# Patient Record
Sex: Female | Born: 1990 | Race: White | Hispanic: No | Marital: Single | Smoking: Never smoker
Health system: Southern US, Community
[De-identification: ages and names within clinical notes are randomized; demographics above are authoritative.]

## PROBLEM LIST (undated history)

## (undated) DIAGNOSIS — R51 Headache: Secondary | ICD-10-CM

## (undated) DIAGNOSIS — B019 Varicella without complication: Secondary | ICD-10-CM

## (undated) HISTORY — DX: Varicella without complication: B01.9

## (undated) HISTORY — DX: Headache: R51

---
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
## 2012-10-21 ENCOUNTER — Ambulatory Visit (INDEPENDENT_AMBULATORY_CARE_PROVIDER_SITE_OTHER): Payer: PRIVATE HEALTH INSURANCE | Admitting: Family Medicine

## 2012-10-21 ENCOUNTER — Encounter: Payer: Self-pay | Admitting: Family Medicine

## 2012-10-21 VITALS — BP 110/70 | HR 72 | Temp 99.3°F | Resp 12 | Ht 64.0 in | Wt 222.0 lb

## 2012-10-21 DIAGNOSIS — IMO0002 Reserved for concepts with insufficient information to code with codable children: Secondary | ICD-10-CM

## 2012-10-21 DIAGNOSIS — S86899A Other injury of other muscle(s) and tendon(s) at lower leg level, unspecified leg, initial encounter: Secondary | ICD-10-CM

## 2012-10-21 DIAGNOSIS — L42 Pityriasis rosea: Secondary | ICD-10-CM

## 2012-10-21 NOTE — Progress Notes (Signed)
  Subjective:    Patient ID: [name], female    DOB: 12-25-1990, 22 y.o.   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  She had recent rash which was pruritic and started on her trunk and subsequently arms and upper thighs. Rash had erythematous base and scaly surface and pruritic. She initially thought this may have represented guttate psoriasis but it appears she may have had a herald patch more typical of pityriasis rosea. Onset was about 6 weeks ago and now resolving.  She had occasional shin splints but currently none. These were worst right anterior leg. Improved with icing.  She takes no chronic medications. Breast reduction surgery 2012. Immunizations reportedly up-to-date  Past Medical History  Diagnosis Date  . Chicken pox   . Headache    Past Surgical History  Procedure Laterality Date  . Breast reduction surgery  2012    reports that she has never smoked. She does not have any smokeless tobacco history on file. Her alcohol and drug histories are not on file. family history includes Cancer in her paternal grandfather; Diabetes in her maternal grandfather and maternal grandmother; Hyperlipidemia in her father and paternal grandfather; Hypertension in her father and paternal grandmother; and Stroke in her paternal grandmother. No Known Allergies    Review of Systems  Constitutional: Negative for fever, activity change, appetite change, fatigue and unexpected weight change.  HENT: Negative for hearing loss, ear pain, sore throat and trouble swallowing.   Eyes: Negative for visual disturbance.  Respiratory: Negative for cough and shortness of breath.   Cardiovascular: Negative for chest pain and palpitations.  Gastrointestinal: Negative for abdominal pain, diarrhea, constipation and blood in stool.   Genitourinary: Negative for dysuria and hematuria.  Musculoskeletal: Negative for myalgias, back pain and arthralgias.  Skin: Positive for rash.  Neurological: Negative for dizziness, syncope and headaches.  Hematological: Negative for adenopathy.  Psychiatric/Behavioral: Negative for confusion and dysphoric mood.       Objective:   Physical Exam  Constitutional: She appears well-developed and well-nourished.  Neck: Neck supple. No thyromegaly present.  Cardiovascular: Normal rate and regular rhythm.   Pulmonary/Chest: Effort normal and breath sounds normal. No respiratory distress. She has no wheezes. She has no rales.  Musculoskeletal: She exhibits no edema.  Lymphadenopathy:    She has no cervical adenopathy.  Skin: Rash noted.  Patient has very faint rash erythematous base is slightly scaly but appears to be fading back and upper thigh region          Assessment & Plan:  #1 resolving rash. 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 No further treatment #2 possible intermittent shin splints. Currently asymptomatic. We discussed good cushioned shoe wear and icing after activities if this recurs along with regular stretches #3 patient requesting forms be completed for upcoming travel. This is completed. She is not requiring any immunizations at this time

## 2012-10-21 NOTE — Patient Instructions (Addendum)
Pityriasis Rosea Pityriasis rosea is a rash which is probably caused by a virus. It generally starts as a scaly, red patch on the trunk (the area of the body that a t-shirt would cover) but does not appear on sun exposed areas. The rash is usually preceded by an initial larger spot called the "herald patch" a week or more before the rest of the rash appears. Generally within one to two days the rash appears rapidly on the trunk, upper arms, and sometimes the upper legs. The rash usually appears as flat, oval patches of scaly pink color. The rash can also be raised and one is able to feel it with a finger. The rash can also be finely crinkled and may slough off leaving a ring of scale around the spot. Sometimes a mild sore throat is present with the rash. It usually affects children and young adults in the spring and autumn. Women are more frequently affected than men. TREATMENT  Pityriasis rosea is a self-limited condition. This means it goes away within 4 to 8 weeks without treatment. The spots may persist for several months, especially in darker-colored skin after the rash has resolved and healed. Benadryl and steroid creams may be used if itching is a problem. SEEK MEDICAL CARE IF:   Your rash does not go away or persists longer than three months.  You develop fever and joint pain.  You develop severe headache and confusion.  You develop breathing difficulty, vomiting and/or extreme weakness. Document Released: 07/12/2001 Document Revised: 08/28/2011 Document Reviewed: 07/31/2008 Exit[employer] Patient Information 2013 [employer].  

## 2015-06-24 ENCOUNTER — Ambulatory Visit (INDEPENDENT_AMBULATORY_CARE_PROVIDER_SITE_OTHER): Payer: Commercial Managed Care - HMO | Admitting: Family Medicine

## 2015-06-24 ENCOUNTER — Encounter: Payer: Self-pay | Admitting: Family Medicine

## 2015-06-24 VITALS — BP 110/80 | HR 94 | Temp 98.4°F | Ht 64.5 in | Wt 245.1 lb

## 2015-06-24 DIAGNOSIS — R05 Cough: Secondary | ICD-10-CM

## 2015-06-24 DIAGNOSIS — K219 Gastro-esophageal reflux disease without esophagitis: Secondary | ICD-10-CM | POA: Diagnosis not present

## 2015-06-24 DIAGNOSIS — R059 Cough, unspecified: Secondary | ICD-10-CM

## 2015-06-24 DIAGNOSIS — Z23 Encounter for immunization: Secondary | ICD-10-CM | POA: Diagnosis not present

## 2015-06-24 MED ORDER — MONTELUKAST SODIUM 10 MG PO TABS: 10.0000 mg | ORAL_TABLET | Freq: Every day | ORAL | Status: DC

## 2015-06-24 NOTE — Progress Notes (Signed)
   Subjective:    Patient ID: [name], female    DOB: 1991/03/15, 25 y.o.   MRN: [id number]  HPI Patient seen with frequent cough Nonsmoker no history of asthma. This fall particularly she has had recurrent problems with "feeling anxious followed by cough is sometimes sensation that her chest feels "tight". No exertional chest pain. She feels she may be wheezing occasionally but mostly has cough which can last sometimes for hours.  No appetite or weight changes. No dyspnea. She does have frequent GERD symptoms. Also frequent postnasal drip symptoms. Recently is increased her caffeine consumption. No alleviating factors. Cough seems exacerbated by anxiety.  Past Medical History  Diagnosis Date  . Chicken pox   . EXBMWUXL(244.0Headache(784.0)    Past Surgical History  Procedure Laterality Date  . Breast reduction surgery  2012    reports that she has never smoked. She does not have any smokeless tobacco history on file. Her alcohol and drug histories are not on file. family history includes Cancer in her paternal grandfather; Diabetes in her maternal grandfather and maternal grandmother; Hyperlipidemia in her father and paternal grandfather; Hypertension in her father and paternal grandmother; Stroke in her paternal grandmother. No Known Allergies    Review of Systems  Constitutional: Negative for fever, chills, appetite change, fatigue and unexpected weight change.  HENT: Positive for postnasal drip.   Respiratory: Positive for cough. Negative for shortness of breath.   Cardiovascular: Negative for chest pain, palpitations and leg swelling.  Gastrointestinal: Negative for nausea and vomiting.  Neurological: Negative for dizziness.  Hematological: Negative for adenopathy.       Objective:   Physical Exam  Constitutional: She appears well-developed and well-nourished.  HENT:  Right Ear: External ear normal.  Left Ear: External ear normal.  Mouth/Throat: Oropharynx is clear and  moist.  Neck: Neck supple.  Cardiovascular: Normal rate and regular rhythm.   Pulmonary/Chest: Effort normal and breath sounds normal. No respiratory distress. She has no wheezes. She has no rales.  Lymphadenopathy:    She has no cervical adenopathy.          Assessment & Plan:  Frequent cough. Although reactive airway disease in differential though she has no obvious wheezing on exam today. Question whether her cough may be related to GERD and/or postnasal drip. We also explained anxiety could be a factor in her cough as well. Reduce caffeine consumption. Handout on GERD given. Try over-the-counter Zantac or Pepcid. Treat postnasal drip symptoms. She will try over-the-counter antihistamine. Singulair 10 mg once daily. Reassess in one month.  Consider further testing at that time with pulmonary function tests pre-and postbronchodilator if symptoms persist

## 2015-06-24 NOTE — Progress Notes (Signed)
Pre visit review using our clinic review tool, if applicable. No additional management support is needed unless otherwise documented below in the visit note. 

## 2015-06-24 NOTE — Patient Instructions (Addendum)
Food Choices for Gastroesophageal Reflux Disease, Adult When you have gastroesophageal reflux disease (GERD), the foods you eat and your eating habits are very important. Choosing the right foods can help ease the discomfort of GERD. WHAT GENERAL GUIDELINES DO I NEED TO FOLLOW?  Choose fruits, vegetables, whole grains, low-fat dairy products, and low-fat meat, fish, and poultry.  Limit fats such as oils, salad dressings, butter, nuts, and avocado.  Keep a food diary to identify foods that cause symptoms.  Avoid foods that cause reflux. These may be different for different people.  Eat frequent small meals instead of three large meals each day.  Eat your meals slowly, in a relaxed setting.  Limit fried foods.  Cook foods using methods other than frying.  Avoid drinking alcohol.  Avoid drinking large amounts of liquids with your meals.  Avoid bending over or lying down until 2-3 hours after eating. WHAT FOODS ARE NOT RECOMMENDED? The following are some foods and drinks that may worsen your symptoms: Vegetables Tomatoes. Tomato juice. Tomato and spaghetti sauce. Chili peppers. Onion and garlic. Horseradish. Fruits Oranges, grapefruit, and lemon (fruit and juice). Meats High-fat meats, fish, and poultry. This includes hot dogs, ribs, ham, sausage, salami, and bacon. Dairy Whole milk and chocolate milk. Sour cream. Cream. Butter. Ice cream. Cream cheese.  Beverages Coffee and tea, with or without caffeine. Carbonated beverages or energy drinks. Condiments Hot sauce. Barbecue sauce.  Sweets/Desserts Chocolate and cocoa. Donuts. Peppermint and spearmint. Fats and Oils High-fat foods, including [place] fries and potato chips. Other Vinegar. Strong spices, such as black pepper, white pepper, red pepper, cayenne, curry powder, cloves, ginger, and chili powder. The items listed above may not be a complete list of foods and beverages to avoid. Contact your dietitian for more  information.   This information is not intended to replace advice given to you by your health care provider. Make sure you discuss any questions you have with your health care provider.   Document Released: 06/05/2005 Document Revised: 06/26/2014 Document Reviewed: 04/09/2013 Elsevier Interactive Patient Education 2016 [employer]  Consider over the counter Zantac of Pepcid for any reflux symptoms.

## 2015-07-27 ENCOUNTER — Encounter: Payer: Self-pay | Admitting: Family Medicine

## 2015-07-27 ENCOUNTER — Ambulatory Visit (INDEPENDENT_AMBULATORY_CARE_PROVIDER_SITE_OTHER): Payer: Commercial Managed Care - HMO | Admitting: Family Medicine

## 2015-07-27 VITALS — BP 100/80 | HR 104 | Temp 98.4°F | Ht 64.5 in | Wt 239.2 lb

## 2015-07-27 DIAGNOSIS — R05 Cough: Secondary | ICD-10-CM | POA: Diagnosis not present

## 2015-07-27 DIAGNOSIS — R053 Chronic cough: Secondary | ICD-10-CM

## 2015-07-27 NOTE — Progress Notes (Signed)
Pre visit review using our clinic review tool, if applicable. No additional management support is needed unless otherwise documented below in the visit note. 

## 2015-07-27 NOTE — Patient Instructions (Signed)
Consider chlorpheniramine at night for postnasal drip symptoms. Consider Flonase or Nasacort for nasal congestion symptoms

## 2015-07-27 NOTE — Progress Notes (Signed)
   Subjective:    Patient ID: [name], female    DOB: 1991/03/27, 25 y.o.   MRN: [id number]  HPI Patient is seen for follow-up regarding chronic cough. Refer to prior dictation. We suspected probably combination of GERD and postnasal drip. Question of some reactive airway component. Patient started Singulair and states that she is at least 50% improved. Has not elevated head of bed. Still has some postnasal drip symptoms.  Cough nonproductive. No fevers or chills. No dyspnea. Start exercising without difficulty. Never smoked  Past Medical History  Diagnosis Date  . Chicken pox   . MVHQIONG(295.2)    Past Surgical History  Procedure Laterality Date  . Breast reduction surgery  2012    reports that she has never smoked. She does not have any smokeless tobacco history on file. Her alcohol and drug histories are not on file. family history includes Cancer in her paternal grandfather; Diabetes in her maternal grandfather and maternal grandmother; Hyperlipidemia in her father and paternal grandfather; Hypertension in her father and paternal grandmother; Stroke in her paternal grandmother. No Known Allergies    Review of Systems  Constitutional: Negative for fever, chills, appetite change and unexpected weight change.  HENT: Positive for congestion.   Respiratory: Positive for cough. Negative for shortness of breath and wheezing.   Cardiovascular: Negative for chest pain.       Objective:   Physical Exam  Constitutional: She appears well-developed and well-nourished.  HENT:  Right Ear: External ear normal.  Left Ear: External ear normal.  Mouth/Throat: Oropharynx is clear and moist.  Neck: Neck supple.  Cardiovascular: Normal rate and regular rhythm.   No murmur heard. Pulmonary/Chest: Effort normal and breath sounds normal. No respiratory distress. She has no wheezes. She has no rales.          Assessment & Plan:  Chronic cough. Differential remains likely GERD  and/or postnasal drip. No obvious reactive airway component. She will continue Singulair. Consider over-the-counter chlorpheniramine at night for postnasal drip symptoms. Continue Zantac or Pepcid. Elevate head of bed 6-8 inches. Touch base if cough not continuing to resolve over the next few weeks

## 2015-10-26 ENCOUNTER — Other Ambulatory Visit: Payer: Self-pay | Admitting: Family Medicine

## 2016-08-01 DIAGNOSIS — R110 Nausea: Secondary | ICD-10-CM | POA: Diagnosis not present

## 2016-08-01 DIAGNOSIS — R197 Diarrhea, unspecified: Secondary | ICD-10-CM | POA: Diagnosis not present

## 2016-09-13 ENCOUNTER — Ambulatory Visit (INDEPENDENT_AMBULATORY_CARE_PROVIDER_SITE_OTHER): Payer: Commercial Managed Care - HMO | Admitting: Family Medicine

## 2016-09-13 ENCOUNTER — Encounter: Payer: Self-pay | Admitting: Family Medicine

## 2016-09-13 VITALS — BP 120/80 | HR 84 | Temp 98.2°F | Wt 217.9 lb

## 2016-09-13 DIAGNOSIS — R229 Localized swelling, mass and lump, unspecified: Secondary | ICD-10-CM

## 2016-09-13 MED ORDER — DICLOFENAC SODIUM 1 % TD GEL: 4.0000 g | Freq: Four times a day (QID) | TRANSDERMAL | 2 refills | Status: DC

## 2016-09-13 NOTE — Progress Notes (Signed)
Subjective:     Patient ID: [name], female   DOB: 05-28-1991, 26 y.o.   MRN: [id number]  HPI She is seen with left lower back pain intermittently for the past year. She has noted a tender "spot" which is very rounded and mobile. No clear exacerbating factors. She does seem to get some relief with lying supine and sometimes worse with movement. No injury. She's tried heat and topical sports cream with some relief. Infrequently has taken Motrin with some relief. No abdominal pain. No fever. No radiculopathy symptoms. Denies any lower extremity numbness or weakness.  Past Medical History:  Diagnosis Date  . Chicken pox   . DGUYQIHK(742.5Headache(784.0)    Past Surgical History:  Procedure Laterality Date  . BREAST REDUCTION SURGERY  2012    reports that she has never smoked. She has never used smokeless tobacco. Her alcohol and drug histories are not on file. family history includes Cancer in her paternal grandfather; Diabetes in her maternal grandfather and maternal grandmother; Hyperlipidemia in her father and paternal grandfather; Hypertension in her father and paternal grandmother; Stroke in her paternal grandmother. No Known Allergies   Review of Systems  Constitutional: Negative for appetite change, chills, fever and unexpected weight change.  Respiratory: Negative for shortness of breath.   Cardiovascular: Negative for chest pain.  Gastrointestinal: Negative for abdominal pain.  Genitourinary: Negative for dysuria.  Musculoskeletal: Positive for back pain.       Objective:   Physical Exam  Constitutional: She appears well-developed and well-nourished.  Cardiovascular: Normal rate and regular rhythm.   Pulmonary/Chest: Effort normal and breath sounds normal. No respiratory distress. She has no wheezes. She has no rales.  Musculoskeletal: She exhibits no edema.  Straight leg raises are negative bilaterally. She has no spinal tenderness. She has rounded mobile tender fibromatous type  lesion left lower back  Neurological:  2+ reflexes knee and ankle bilaterally. Full-strength lower extremities       Assessment:     Left lower lumbar back pain without radiculopathy- suspect fibro-fatty nodule.  Pain seems to be confined to that region.    Plan:     -trial of diclofenac 1% gel 3-4 times daily as needed -We recommended aerobic activity as tolerated -She is encouraged to lose some weight and also work on strengthening core muscles -touch base in 2 weeks if no relief.   Kristian CoveyBruce W Creedon Danielski MD [facility]

## 2016-09-13 NOTE — Progress Notes (Signed)
Pre visit review using our clinic review tool, if applicable. No additional management support is needed unless otherwise documented below in the visit note. 

## 2016-09-13 NOTE — Patient Instructions (Signed)
Touch base in a few weeks if no improvement with the Diclofenac gel.

## 2016-11-28 DIAGNOSIS — R05 Cough: Secondary | ICD-10-CM | POA: Diagnosis not present

## 2016-11-28 DIAGNOSIS — J0100 Acute maxillary sinusitis, unspecified: Secondary | ICD-10-CM | POA: Diagnosis not present

## 2016-11-28 DIAGNOSIS — H66001 Acute suppurative otitis media without spontaneous rupture of ear drum, right ear: Secondary | ICD-10-CM | POA: Diagnosis not present

## 2017-02-09 ENCOUNTER — Ambulatory Visit (INDEPENDENT_AMBULATORY_CARE_PROVIDER_SITE_OTHER): Payer: 59 | Admitting: Family Medicine

## 2017-02-09 DIAGNOSIS — Z0000 Encounter for general adult medical examination without abnormal findings: Secondary | ICD-10-CM | POA: Diagnosis not present

## 2017-02-09 DIAGNOSIS — Z111 Encounter for screening for respiratory tuberculosis: Secondary | ICD-10-CM | POA: Diagnosis not present

## 2017-02-09 NOTE — Progress Notes (Signed)
Subjective:     Patient ID: [name], female   DOB: 1990-10-11, 26 y.o.   MRN: [id number]  HPI Patient seen for physical exam. 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 Generally healthy. Takes no regular medications. She is fairly certain she had tetanus booster about 3 years ago. She lived in [place] up until 6 years ago. No history of tuberculosis. No recent cough or fever. She sees gynecologist for pelvic exams and Pap smears. Nonsmoker.  Past Medical History:  Diagnosis Date  . Chicken pox   . JKDTOIZT(245.8)    Past Surgical History:  Procedure Laterality Date  . BREAST REDUCTION SURGERY  2012    reports that she has never smoked. She has never used smokeless tobacco. Her alcohol and drug histories are not on file. family history includes Cancer in her paternal grandfather; Diabetes in her maternal grandfather and maternal grandmother; Hyperlipidemia in her father and paternal grandfather; Hypertension in her father and paternal grandmother; Stroke in her paternal grandmother. No Known Allergies   Review of Systems  Constitutional: Negative for activity change, appetite change, fatigue, fever and unexpected weight change.  HENT: Negative for ear pain, hearing loss, sore throat and trouble swallowing.   Eyes: Negative for visual disturbance.  Respiratory: Negative for cough and shortness of breath.   Cardiovascular: Negative for chest pain and palpitations.  Gastrointestinal: Negative for abdominal pain, blood in stool, constipation and diarrhea.  Genitourinary: Negative for dysuria and hematuria.  Musculoskeletal: Negative for arthralgias, back pain and myalgias.  Skin: Negative for rash.  Neurological: Negative for dizziness, syncope and headaches.  Hematological: Negative for adenopathy.  Psychiatric/Behavioral: Negative for confusion and dysphoric mood.       Objective:   Physical Exam  Constitutional: She is oriented to person, place,  and time. She appears well-developed and well-nourished.  HENT:  Head: Normocephalic and atraumatic.  Eyes: Pupils are equal, round, and reactive to light. EOM are normal.  Neck: Normal range of motion. Neck supple. No thyromegaly present.  Cardiovascular: Normal rate, regular rhythm and normal heart sounds.  Friction rub: .bwbn.   No murmur heard. Pulmonary/Chest: Breath sounds normal. No respiratory distress. She has no wheezes. She has no rales.  Abdominal: Soft. Bowel sounds are normal. She exhibits no distension and no mass. There is no tenderness. There is no rebound and no guarding.  Musculoskeletal: Normal range of motion. She exhibits no edema.  Lymphadenopathy:    She has no cervical adenopathy.  Neurological: She is alert and oriented to person, place, and time. She displays normal reflexes. No cranial nerve deficit.  Skin: No rash noted.  Psychiatric: She has a normal mood and affect. Her behavior is normal. Judgment and thought content normal.       Assessment:     Physical exam. No chronic medical problems    Plan:     -obtain blood screening for tuberculosis (particularly with prior living abroad). She was unable to come back in 72 hours to check skin test. -We discussed other screening labs such as cholesterol and blood sugar and she declines at this time -We'll advise flu vaccine and she wishes to wait until later in the season  Kristian Covey MD [facility] at [facility]

## 2017-02-10 LAB — QUANTIFERON TB GOLD ASSAY (BLOOD)
Interferon Gamma Release Assay: NEGATIVE
Mitogen-Nil: 6.84 IU/mL
QUANTIFERON NIL VALUE: 0.07 [IU]/mL
Quantiferon Tb Ag Minus Nil Value: <0 IU/mL

## 2017-03-23 ENCOUNTER — Encounter: Payer: Self-pay | Admitting: Family Medicine

## 2017-03-23 ENCOUNTER — Ambulatory Visit (INDEPENDENT_AMBULATORY_CARE_PROVIDER_SITE_OTHER): Payer: BC Managed Care – PPO | Admitting: Family Medicine

## 2017-03-23 VITALS — BP 110/80 | HR 82 | Temp 98.4°F | Wt 226.2 lb

## 2017-03-23 DIAGNOSIS — J0110 Acute frontal sinusitis, unspecified: Secondary | ICD-10-CM | POA: Diagnosis not present

## 2017-03-23 MED ORDER — AMOXICILLIN-POT CLAVULANATE 875-125 MG PO TABS: 1.0000 | ORAL_TABLET | Freq: Two times a day (BID) | ORAL | 0 refills | Status: DC

## 2017-03-23 NOTE — Patient Instructions (Signed)
Warm compresses several times daily Consider OTC sudafed Stay well hydrated Consider OTC Mucinex.

## 2017-03-23 NOTE — Progress Notes (Signed)
Subjective:     Patient ID: [name], [name]   DOB: 01/24/1991, 26 y.o.   MRN: [id number]  HPI   Patient seen with pain right frontal sinus region past few days. She's also had some right ear fullness and pain around her right ear region. Minimal postnasal congestion and drainage. No purulent secretions. No fevers or chills. No nausea or vomiting. Questionable history of mild migraines in the past but these symptoms are slightly different. No clear exacerbating factors. Symptoms are relatively continuous. She's not tried any decongestants or other medications. Pain is non-throbbing and she does not have any light sensitivity or significant nausea.  Past Medical History:  Diagnosis Date  . Chicken pox   . NWGNFAOZ(308.6)    Past Surgical History:  Procedure Laterality Date  . BREAST REDUCTION SURGERY  2012    reports that she has never smoked. She has never used smokeless tobacco. Her alcohol and drug histories are not on file. family history includes Cancer in her paternal grandfather; Diabetes in her maternal grandfather and maternal grandmother; Hyperlipidemia in her father and paternal grandfather; Hypertension in her father and paternal grandmother; Stroke in her paternal grandmother. No Known Allergies   Review of Systems  Constitutional: Negative for chills and fever.  HENT: Negative for ear discharge and hearing loss.   Respiratory: Negative for cough and shortness of breath.        Objective:   Physical Exam  Constitutional: She appears well-developed and well-nourished.  HENT:  Right Ear: External ear normal.  Left Ear: External ear normal.  Mouth/Throat: Oropharynx is clear and moist.  Neck: Neck supple.  Cardiovascular: Normal rate and regular rhythm.   Pulmonary/Chest: Effort normal and breath sounds normal. No respiratory distress. She has no wheezes. She has no rales.  Lymphadenopathy:    She has no cervical adenopathy.       Assessment:     Patient  seen with three-day history of right retro-orbital pain. Question sinus related. Doubt migraine.    Plan:     -Try over-the-counter decongestant such as Sudafed -Start over-the-counter Mucinex -Warm compresses several times daily -Printed prescription for Augmentin to start one twice a day if she has any purulent secretions or worsening symptoms -follow up for any persistent headache or other concerns.  Kristian Covey MD [facility] at [facility]

## 2017-04-11 ENCOUNTER — Telehealth: Payer: Self-pay | Admitting: Family Medicine

## 2017-04-11 MED ORDER — FLUCONAZOLE 150 MG PO TABS: 150.0000 mg | ORAL_TABLET | Freq: Once | ORAL | 0 refills | Status: AC

## 2017-04-11 NOTE — Telephone Encounter (Signed)
Fluconazole 150mg #1 dose

## 2017-04-11 NOTE — Telephone Encounter (Signed)
Pt was seen on 03/23/17 and given abx now has yeast infection. Pt would like diflucan send to [facility]

## 2017-07-18 ENCOUNTER — Encounter: Payer: Self-pay | Admitting: Family Medicine

## 2017-07-18 ENCOUNTER — Ambulatory Visit: Payer: BC Managed Care – PPO | Admitting: Family Medicine

## 2017-07-18 VITALS — BP 110/80 | HR 81 | Temp 98.3°F | Wt 204.5 lb

## 2017-07-18 DIAGNOSIS — J069 Acute upper respiratory infection, unspecified: Secondary | ICD-10-CM

## 2017-07-18 MED ORDER — DOXYCYCLINE HYCLATE 100 MG PO CAPS: 100.0000 mg | ORAL_CAPSULE | Freq: Two times a day (BID) | ORAL | 0 refills | Status: DC

## 2017-07-18 NOTE — Progress Notes (Signed)
Subjective:     Patient ID: [name], female   DOB: 1990/10/31, 27 y.o.   MRN: [id number]  HPI Patient seen with onset about a week ago of cough, sore throat, body aches, nasal congestion. She felt somewhat better until 2 days ago when she seemed to have a relapse. She thinks she has some low-grade fever last weekend. Tried Mucinex and also doing saline nasal irrigation with Nettie pot.  Past Medical History:  Diagnosis Date  . Chicken pox   . WUJWJXBJ(478.2Headache(784.0)    Past Surgical History:  Procedure Laterality Date  . BREAST REDUCTION SURGERY  2012    reports that  has never smoked. she has never used smokeless tobacco. Her alcohol and drug histories are not on file. family history includes Cancer in her paternal grandfather; Diabetes in her maternal grandfather and maternal grandmother; Hyperlipidemia in her father and paternal grandfather; Hypertension in her father and paternal grandmother; Stroke in her paternal grandmother. No Known Allergies   Review of Systems  Constitutional: Positive for fatigue. Negative for chills and fever.  HENT: Positive for congestion, postnasal drip, sinus pain and sore throat.   Respiratory: Positive for cough.        Objective:   Physical Exam  Constitutional: She appears well-developed and well-nourished. No distress.  HENT:  Right Ear: External ear normal.  Left Ear: External ear normal.  Mouth/Throat: Oropharynx is clear and moist.  Neck: Neck supple.  Cardiovascular: Normal rate and regular rhythm.  Pulmonary/Chest: Effort normal and breath sounds normal. No respiratory distress. She has no wheezes. She has no rales.  Lymphadenopathy:    She has no cervical adenopathy.       Assessment:     Acute sinusitis. We explained most sinusitis is viral.    Plan:     -stay well-hydrated and continue Mucinex and saline nasal irrigation -Printed prescription for doxycycline to start 100 mg twice daily for 10 days if she has any persistent  or worsening symptoms  Kristian CoveyBruce W Maliik Karner MD [facility]

## 2018-03-14 ENCOUNTER — Other Ambulatory Visit: Payer: Self-pay | Admitting: Obstetrics & Gynecology

## 2018-03-14 DIAGNOSIS — N6320 Unspecified lump in the left breast, unspecified quadrant: Secondary | ICD-10-CM

## 2018-06-26 ENCOUNTER — Encounter: Payer: Self-pay | Admitting: Family Medicine

## 2018-06-26 ENCOUNTER — Other Ambulatory Visit: Payer: Self-pay

## 2018-06-26 ENCOUNTER — Ambulatory Visit: Payer: BC Managed Care – PPO | Admitting: Family Medicine

## 2018-06-26 VITALS — BP 128/76 | HR 79 | Temp 98.2°F | Ht 63.0 in | Wt 198.9 lb

## 2018-06-26 DIAGNOSIS — F32A Depression, unspecified: Secondary | ICD-10-CM

## 2018-06-26 DIAGNOSIS — F419 Anxiety disorder, unspecified: Secondary | ICD-10-CM | POA: Diagnosis not present

## 2018-06-26 DIAGNOSIS — F329 Major depressive disorder, single episode, unspecified: Secondary | ICD-10-CM

## 2018-06-26 NOTE — Patient Instructions (Signed)
We will set up psychiatry referral.  Keep caffeine intake low. Follow up immediately for any increased suicidal ideation.

## 2018-06-26 NOTE — Progress Notes (Signed)
Subjective:     Patient ID: [name], female   DOB: 1990/10/09, 28 y.o.   MRN: [id number]  HPI Patient is seen with concerns for progressive anxiety symptoms and "panic attacks ".  Not clear that she has been formally diagnosed with this (panic disorder) in the past.  She has been to some type of mood treatment center near [place] and apparently saw a Publishing rights manager.  We have no records.  She is currently on Prozac 40 mg daily.  She states she underwent break-up with boyfriend recently and that has exacerbated her depression and anxiety symptoms.  She has increased depression and anxiety at baseline but has had a few discrete episodes of anxiety which usually last about 30 minutes of symptoms including dyspnea, nausea, shakes, tremors.  She is consistently taking her Prozac.  She states she is had a long history of some increased irritability and outbursts of anger at times.  She has been getting some counseling therapy She had some fleeting suicidal thoughts but no active suicidal ideation at this time.  She has had some difficulty sleeping.  She has been taking over-the-counter Benadryl type medication with some relief.  Has had periods of more impulsive behavior and decreased sleep, associated with increased irritability.  No FH of bipolar.    Past Medical History:  Diagnosis Date  . Chicken pox   . PNTIRWER(154.0)    Past Surgical History:  Procedure Laterality Date  . BREAST REDUCTION SURGERY  2012    reports that she has never smoked. She has never used smokeless tobacco. No history on file for alcohol and drug. family history includes Cancer in her paternal grandfather; Diabetes in her maternal grandfather and maternal grandmother; Hyperlipidemia in her father and paternal grandfather; Hypertension in her father and paternal grandmother; Stroke in her paternal grandmother. No Known Allergies   Review of Systems  Constitutional: Negative for appetite change and  unexpected weight change.  Cardiovascular: Negative for chest pain.  Psychiatric/Behavioral: Positive for dysphoric mood and sleep disturbance. Negative for confusion and self-injury. The patient is nervous/anxious.        Objective:   Physical Exam Constitutional:      Appearance: Normal appearance.  Cardiovascular:     Rate and Rhythm: Normal rate and regular rhythm.  Pulmonary:     Effort: Pulmonary effort is normal.     Breath sounds: Normal breath sounds.  Neurological:     General: No focal deficit present.     Mental Status: She is alert and oriented to person, place, and time.  Psychiatric:     Comments: PHQ 9 score of 20.  Mood disorder questionnaire 13/13+ response        Assessment:     Patient presents with some progressive anxiety and depression symptoms.  Very high score on PHQ 9 as well as positive mood disorder questionnaire.  This raises concern for possible bipolar.  Patient is concerned that she may have bipolar or borderline personality disorder.  No active suicidal thoughts currently.  She is also describing some discrete episodes of anxiety that sound like possible panic disorder.    Plan:     -We recommend a psychiatry referral to try to get in as soon as possible -If unable to get in soon we may consider starting mood stabilizer -She knows to follow-up immediately for any suicidal ideation or other concerns -she will continue with the Prozac at this time.  Kristian Covey MD [facility] at [facility]

## 2018-06-28 ENCOUNTER — Telehealth: Payer: Self-pay

## 2018-06-28 NOTE — Telephone Encounter (Signed)
Called patient and left a voice message for her to call us back.  OK for PEC to discuss/schedule.  Per Dr. Caryl Never, please reach out to   1) Dr. Alanson Aly at [facility] at [address], [place], [place] [address] and their number is [phone]  OR  2) Dr. Milagros Evener at [facility] at [address], [place], [place] [address] and their number is [phone]  CRM Created.

## 2018-07-01 ENCOUNTER — Telehealth: Payer: Self-pay

## 2018-07-01 NOTE — Telephone Encounter (Signed)
Just an FYI, patient has been given info.  Copied from CRM [id number]. Topic: Higher education careers adviser Patient (Clinic Use ONLY) >> Jun 28, 2018  4:01 PM Ronita Hipps, CMA wrote: Reason for CRM: Called patient and left a voice message for her to call us back.  OK for PEC to discuss/schedule.  Per Dr. Caryl Never, please reach out to   1) Dr. Alanson Aly at [facility] at [address], [place], [place] [address] and their number is [phone]  OR  2) Dr. Milagros Evener at [facility] at [address], [place], [place] [address] and their number is [phone]  CRM Created. >> Jul 01, 2018 11:08 AM Leafy Ro wrote: Pt has been given the name and phone number of psych >> Jul 01, 2018 12:33 PM Maisie Fus, CMA wrote: Will send to [facility] to make aware pt has been notified. Nothing further needed.

## 2018-07-10 ENCOUNTER — Encounter: Payer: Self-pay | Admitting: Family Medicine

## 2018-07-16 ENCOUNTER — Other Ambulatory Visit: Payer: Self-pay

## 2018-07-16 MED ORDER — ARIPIPRAZOLE 5 MG PO TABS: 5.0000 mg | ORAL_TABLET | Freq: Every day | ORAL | 0 refills | Status: DC

## 2018-07-29 ENCOUNTER — Ambulatory Visit: Payer: BC Managed Care – PPO | Admitting: Family Medicine

## 2018-07-29 ENCOUNTER — Other Ambulatory Visit: Payer: Self-pay

## 2018-07-29 ENCOUNTER — Encounter: Payer: Self-pay | Admitting: Family Medicine

## 2018-07-29 VITALS — BP 124/82 | HR 83 | Temp 98.4°F | Ht 63.0 in | Wt 203.9 lb

## 2018-07-29 DIAGNOSIS — F419 Anxiety disorder, unspecified: Secondary | ICD-10-CM

## 2018-07-29 DIAGNOSIS — F329 Major depressive disorder, single episode, unspecified: Secondary | ICD-10-CM | POA: Diagnosis not present

## 2018-07-29 MED ORDER — ARIPIPRAZOLE 5 MG PO TABS: 5.0000 mg | ORAL_TABLET | Freq: Every day | ORAL | 0 refills | Status: DC

## 2018-07-29 NOTE — Progress Notes (Signed)
  Subjective:     Patient ID: [name], female   DOB: 08-03-1990, 28 y.o.   MRN: [id number]  HPI Patient seen for follow-up regarding anxiety and depression.  Refer to previous note from 06/26/2018.  She had had previous question of panic disorder.  She came to [place] on Prozac 40 mg daily.  Recent break-up with boyfriend which she felt exacerbated her depression and anxiety symptoms.  She had noted increased irritability and outbursts of anger frequently.  She had been getting some regular counseling.  She had difficulty sleeping.  She was also concerned about some impulsive behaviors and decreased need for sleep at times.  We had concern for possible bipolar disorder because of positive mood disorders questionnaire.  She also scored 20 on PHQ 9.  We recommend a psychiatry referral for further evaluation.  Unfortunately though she could not get in until March.  We ended up adding Abilify 5 mg at night and she thinks this has helped tremendously with her mood.  She thinks this has helped predominantly with her depression symptoms.  No suicidal ideation.  She is coping better with anxiety as well.  She is also sleeping somewhat better.  Has noticed some improvement in appetite over the past couple weeks as well  Past Medical History:  Diagnosis Date  . Chicken pox   . TMAUQJFH(545.6)    Past Surgical History:  Procedure Laterality Date  . BREAST REDUCTION SURGERY  2012    reports that she has never smoked. She has never used smokeless tobacco. She reports current alcohol use of about 10.0 - 11.0 standard drinks of alcohol per week. She reports current drug use. Drug: Marijuana. family history includes Cancer in her paternal grandfather; Diabetes in her maternal grandfather and maternal grandmother; Hyperlipidemia in her father and paternal grandfather; Hypertension in her father and paternal grandmother; Stroke in her paternal grandmother. No Known Allergies   Review of Systems  Respiratory:  Negative for shortness of breath.   Cardiovascular: Negative for chest pain.  Neurological: Negative for dizziness and headaches.  Psychiatric/Behavioral: Negative for agitation, confusion, hallucinations, sleep disturbance and suicidal ideas. The patient is nervous/anxious.        Objective:   Physical Exam Constitutional:      Appearance: Normal appearance.  Cardiovascular:     Rate and Rhythm: Normal rate and regular rhythm.  Pulmonary:     Effort: Pulmonary effort is normal.     Breath sounds: Normal breath sounds.  Neurological:     Mental Status: She is alert.  Psychiatric:     Comments: PHQ 9 score of 13 compared with 20 few weeks ago        Assessment:     Depression and anxiety-improved with addition of Abilify to her Prozac.    Plan:     -She is encouraged to keep her psychiatry appointment which is been made.  She will continue with her counseling in the meantime.  Kristian Covey MD [facility] at [facility]

## 2018-08-14 ENCOUNTER — Encounter: Payer: Self-pay | Admitting: Family Medicine

## 2018-08-16 ENCOUNTER — Other Ambulatory Visit: Payer: Self-pay

## 2018-08-16 ENCOUNTER — Encounter: Payer: Self-pay | Admitting: Family Medicine

## 2018-08-16 ENCOUNTER — Ambulatory Visit: Payer: BC Managed Care – PPO | Admitting: Family Medicine

## 2018-08-16 VITALS — BP 120/78 | HR 96 | Temp 97.9°F | Ht 63.0 in | Wt 213.9 lb

## 2018-08-16 DIAGNOSIS — J019 Acute sinusitis, unspecified: Secondary | ICD-10-CM

## 2018-08-16 MED ORDER — FLUCONAZOLE 150 MG PO TABS: 150.0000 mg | ORAL_TABLET | Freq: Once | ORAL | 0 refills | Status: AC

## 2018-08-16 MED ORDER — AMOXICILLIN-POT CLAVULANATE 875-125 MG PO TABS: 1.0000 | ORAL_TABLET | Freq: Two times a day (BID) | ORAL | 0 refills | Status: DC

## 2018-08-16 NOTE — Progress Notes (Signed)
  Subjective:     Patient ID: [name], female   DOB: 1991-06-11, 28 y.o.   MRN: [id number]  HPI Patient had typical URI few weeks ago.  After about 7 to 10 days her symptoms were improving but then about a week ago she seemed to relapse with headaches, maxillary facial pain and pressure, increased fatigue, postnasal drainage, sore throat, occasional hoarseness.  She is taken over-the-counter Mucinex and drinking hot teas.  She has noticed some yellow discoloration with her mucus over the past week.  Past Medical History:  Diagnosis Date  . Chicken pox   . NOIBBCWU(889.1)    Past Surgical History:  Procedure Laterality Date  . BREAST REDUCTION SURGERY  2012    reports that she has never smoked. She has never used smokeless tobacco. She reports current alcohol use of about 10.0 - 11.0 standard drinks of alcohol per week. She reports current drug use. Drug: Marijuana. family history includes Cancer in her paternal grandfather; Diabetes in her maternal grandfather and maternal grandmother; Hyperlipidemia in her father and paternal grandfather; Hypertension in her father and paternal grandmother; Stroke in her paternal grandmother. No Known Allergies   Review of Systems  Constitutional: Negative for chills and fever.  HENT: Positive for congestion, sinus pressure and sinus pain.   Respiratory: Negative for cough, shortness of breath and wheezing.   Neurological: Positive for headaches.       Objective:   Physical Exam Constitutional:      Appearance: Normal appearance.  HENT:     Right Ear: Tympanic membrane normal.     Left Ear: Tympanic membrane normal.     Mouth/Throat:     Pharynx: Oropharynx is clear. No oropharyngeal exudate or posterior oropharyngeal erythema.  Neck:     Musculoskeletal: Neck supple.  Cardiovascular:     Rate and Rhythm: Normal rate and regular rhythm.  Pulmonary:     Effort: Pulmonary effort is normal.     Breath sounds: Normal breath sounds.   Lymphadenopathy:     Cervical: No cervical adenopathy.  Neurological:     Mental Status: She is alert.        Assessment:     Probable acute sinusitis following recent viral URI    Plan:     -Augmentin 875 mg twice daily with food for 10 days -Patient requesting fluconazole.  She states she has had frequent yeast vaginitis in the past with antibiotics.  Fluconazole 150 mg x 1 dose -Follow-up for any persistent or worsening symptoms  Kristian Covey MD [facility] at [facility]

## 2018-08-19 ENCOUNTER — Encounter: Payer: Self-pay | Admitting: Psychiatry

## 2018-08-19 ENCOUNTER — Ambulatory Visit (INDEPENDENT_AMBULATORY_CARE_PROVIDER_SITE_OTHER): Payer: BC Managed Care – PPO | Admitting: Psychiatry

## 2018-08-19 VITALS — BP 130/77 | HR 95 | Ht 64.0 in | Wt 214.0 lb

## 2018-08-19 DIAGNOSIS — F4001 Agoraphobia with panic disorder: Secondary | ICD-10-CM

## 2018-08-19 DIAGNOSIS — F3162 Bipolar disorder, current episode mixed, moderate: Secondary | ICD-10-CM | POA: Diagnosis not present

## 2018-08-19 DIAGNOSIS — F411 Generalized anxiety disorder: Secondary | ICD-10-CM | POA: Diagnosis not present

## 2018-08-19 MED ORDER — SERTRALINE HCL 100 MG PO TABS: ORAL_TABLET | ORAL | 1 refills | Status: DC

## 2018-08-19 NOTE — Progress Notes (Addendum)
[facility] MD/PA/NP Initial Note  08/19/2018 1:53 PM [name]  MRN:  [id number]  Chief Complaint:  Chief Complaint    Anxiety; Depression      HPI: refer by PCP Dr. Evelena Peat.  Problems with depression and anxiety.  Had anxiety as long as she can remember.  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  Also more anger and bouts of lashing out at BF.  Very paranoid, always wondering if he was telling the truth.  Always worried about things, like when off from school and wondering what was going on there.  Increase panic with breakup with fiance.  Goes to therapy but hard to use the coping skills she's learned.  Hard to get OOB.  Can be productive at work bc restless but goes from no energy to restless.  Pt reports that mood is Angry, Depressed, Dysphoric, Irritable and Worthless and describes anxiety as Panic Attacks. Anxiety symptoms include: Excessive Worry, Panic Symptoms,. Pt reports has difficulty falling asleep, has frequent nighttime awakenings, is not rested upon awakening and sleeps excessively. Pt reports that appetite is poor. Pt reports that energy is poor and anhedonia, loss of interest or pleasure in usual activities, poor motivation and withdrawn from usual activities. Concentration is down slightly. Suicidal thoughts:  patient admits to thoughts but denies active plan or intent.  Lessened a little lately.  Panic now every other week.  When worse it was several per week.  SOB, crying, racing thoughts, can't calm herself, NV.  Last 15 mins.  Sometimes avoids things and going out and seeing people.  Avoiding large stores.  Ok driving.  Has done research into bipolar and borderline personality.  Always been very up and down. bouts of anger she couldn't control.  Has broken things.  MDQ postitive and she does believe she's currently both depressed and hypomanic.  Cycles with multiple sx.  Often mixed sx.  Biggest upswing 18 mos ago.  Way more sexually  active, life of the party, decreased sleep, increased spending, drinking and smoking a lot.  Several episodes.  Not regarding consequences.  Others noticed hyperactivity and hypersexuality.  Currently was taking Prozac 40 and Abilify 5.  Abilify helped mood but extreme restless after 2 weeks and had to stop it.  Off for 2 weeks.  Marijuana every other day for anxiety.  Helps appetite.  Only pm.  Alcohol now 10 or less and used to be higher.  No DUI.   In the past had problems with Xanax and Klonopin and was abusing 18 mos ago.  Visit Diagnosis:    ICD-10-CM   1. Bipolar 1 disorder, mixed, moderate (HCC) F31.62   2. Panic disorder with agoraphobia F40.01   3. Generalized anxiety disorder F41.1     Past Psychiatric History:   [facility].  Not very helpful. Rx Lexapro made chest heavy and worsened anxiety took for 3 weeks. No other psych meds. No hospitalizations. No SA.   Past Medical History:  Past Medical History:  Diagnosis Date  . Chicken pox   . [id number](478.2)     Past Surgical History:  Procedure Laterality Date  . BREAST REDUCTION SURGERY  2012    Family Psychiatric History: Thinks un diagnosed bipolar mGF chronic mania and spending sprees.  Mood cycling.  A lot of mental health problems both sides of family untreated. Alochol problems in F, M drugs rx abuse and takes Zoloft for GAD. 1 B without  Problems known.  Family History:  Family History  Problem Relation Age of Onset  . Hypertension Father   . Hyperlipidemia Father   . Diabetes Maternal Grandmother        type ll  . Diabetes Maternal Grandfather        type ll  . Stroke Paternal Grandmother   . Hypertension Paternal Grandmother   . Cancer Paternal Grandfather        prostate  . Hyperlipidemia Paternal Grandfather     Social History:  Social History   Socioeconomic History  . Marital status: Single    Spouse name: Not on file  . Number of children: Not on file  . Years of  education: Not on file  . Highest education level: Not on file  Occupational History  . Not on file  Social Needs  . Financial resource strain: Not on file  . Food insecurity:    Worry: Not on file    Inability: Not on file  . Transportation needs:    Medical: Not on file    Non-medical: Not on file  Tobacco Use  . Smoking status: Never Smoker  . Smokeless tobacco: Never Used  Substance and Sexual Activity  . Alcohol use: Yes    Alcohol/week: 10.0 - 11.0 standard drinks    Types: 6 - 7 Cans of beer, 4 Glasses of wine per week    Comment: Trying to cut back  . Drug use: Yes    Types: Marijuana  . Sexual activity: Not on file  Lifestyle  . Physical activity:    Days per week: Not on file    Minutes per session: Not on file  . Stress: Not on file  Relationships  . Social connections:    Talks on phone: Not on file    Gets together: Not on file    Attends religious service: Not on file    Active member of club or organization: Not on file    Attends meetings of clubs or organizations: Not on file    Relationship status: Not on file  Other Topics Concern  . Not on file  Social History Narrative  . Not on file   Teacher 6 English.  Function OK now. Breakup with fiance End of December.  Allergies: No Known Allergies  Metabolic Disorder Labs: No results found for: HGBA1C, MPG No results found for: PROLACTIN No results found for: CHOL, TRIG, HDL, CHOLHDL, VLDL, LDLCALC No results found for: TSH  Therapeutic Level Labs: No results found for: LITHIUM No results found for: VALPROATE No components found for:  CBMZ  Current Medications: Current Outpatient Medications  Medication Sig Dispense Refill  . amoxicillin-clavulanate (AUGMENTIN) 875-125 MG tablet Take 1 tablet by mouth 2 (two) times daily. 20 tablet 0  . FLUoxetine (PROZAC) 40 MG capsule Take 40 mg by mouth daily.     . Lactobacillus (PROBIOTIC ACIDOPHILUS PO) Take 1 capsule by mouth daily.    . MULTIPLE  VITAMIN PO Take 1 tablet by mouth daily.    . ARIPiprazole (ABILIFY) 5 MG tablet Take 1 tablet (5 mg total) by mouth daily. (Patient not taking: Reported on 08/16/2018) 30 tablet 0  . sertraline (ZOLOFT) 100 MG tablet 1/2 daily for 1 week, then 1 each day. 30 tablet 1   No current facility-administered medications for this visit.     Medication Side Effects: none  Orders placed this visit:  No orders of the defined types were placed in this encounter.   Psychiatric Specialty Exam:  Review of Systems  Constitutional: Positive for malaise/fatigue. Negative for chills, diaphoresis, fever and weight loss.  HENT: Negative for congestion, ear discharge, ear pain, hearing loss, nosebleeds, sinus pain, sore throat and tinnitus.   Eyes: Positive for blurred vision. Negative for double vision, photophobia, pain, discharge and redness.  Respiratory: Positive for shortness of breath. Negative for cough, hemoptysis, sputum production, wheezing and stridor.   Cardiovascular: Positive for chest pain. Negative for palpitations, orthopnea, claudication, leg swelling and PND.  Gastrointestinal: Positive for diarrhea, nausea and vomiting. Negative for abdominal pain, blood in stool, constipation, heartburn and melena.  Genitourinary: Negative for dysuria, flank pain, frequency, hematuria and urgency.  Musculoskeletal: Negative for back pain, falls, joint pain and myalgias.  Skin: Negative for itching and rash.  Neurological: Positive for headaches. Negative for dizziness, tingling, tremors, sensory change, speech change, focal weakness, seizures, loss of consciousness and weakness.  Endo/Heme/Allergies: Negative for environmental allergies and polydipsia. Does not bruise/bleed easily.  Psychiatric/Behavioral: Positive for depression and substance abuse. Negative for hallucinations, memory loss and suicidal ideas. The patient is nervous/anxious and has insomnia.     Blood pressure 130/77, pulse 95, height 5'  4" (1.626 m), weight 214 lb (97.1 kg).Body mass index is 36.73 kg/m.  General Appearance: Casual and Neat  Eye Contact:  Good  Speech:  Normal Rate  Volume:  Normal  Mood:  Anxious, Depressed, Dysphoric and Irritable  Affect:  Appropriate  Thought Process:  Coherent and Goal Directed  Orientation:  Full (Time, Place, and Person)  Thought Content: Logical   Suicidal Thoughts:  No  Homicidal Thoughts:  No  Memory:  WNL  Judgement:  Good  Insight:  Good  Psychomotor Activity:  Normal  Concentration:  Concentration: Good  Recall:  Good  Fund of Knowledge: Good  Language: Good  Assets:  Communication Skills Desire for Improvement Financial Resources/Insurance Housing Physical Health Talents/Skills Transportation Vocational/Educational  ADL's:  Intact  Cognition: WNL  Prognosis:  Good   Screenings:  PHQ2-9     Office Visit from 09/13/2016 in [facility] [place]  PHQ-2 Total Score  0      Receiving Psychotherapy: Yes at [facility], Shanda Bumps since September  Treatment Plan/Recommendations:  Greater than 50% of face to face time with patient was spent on counseling and coordination of care. We discussed at length her diagnosis including bipolar which is more likely type II mixed and her anxiety disorders.  She is done a fair amount of research on her own.  I answered her questions about her diagnosis and I do not believe at this point we can make a diagnosis of borderline personality disorder until her mood disorder is more adequately controlled.  She had improvement with the Abilify but it caused akathisia.  She is not floridly manic.  She is having significant problems with depression and anxiety.  In general I have found sertraline to be more effective than fluoxetine for panic disorder so we will switch her to that medication.  I explained in detail that this could trigger worsening manic symptoms and likely would if we did not use something that could  function as a mood stabilizer.  She has been informed is what to what the manic symptoms are and to please let us know if she has any.  She agrees.  We discussed the various types of mood stabilizers including the traditional as well as atypical antipsychotic type mood stabilizers.  Because of the benefit in mood she saw with Abilify it makes sense  to use a partial dopamine agonist.  However the other FDA approved bipolar partial dopamine agonist is Vraylar and it has a higher risk of causing akathisia than does Abilify generally.  Rexulti has a lower risk of causing akathisia but is going to be used off label in this case for bipolar mixed disorder.  She understands that.  She understands it may not function effectively enough as a mood stabilizer but should improve her depressive symptoms which are primary at this point. Discussed potential metabolic side effects associated with atypical antipsychotics, as well as potential risk for movement side effects. Advised pt to contact office if movement side effects occur.   We will avoid benzodiazepines because of a history of abuse and dependence.  She is on a moderately high dose of fluoxetine and therefore we will switch to a medium dose of sertraline over the course of 1 week.  She will stop fluoxetine abruptly because of its long half-life.  Start sertraline 50 mg daily for 1 week and then 100 mg daily.  As noted we will start Rexulti 1 mg daily for 1 week and then 2 mg daily.  She may have to go higher on the Rexulti to get it to work as a mood stabilizer she is to let us know if she has any greater mood swings or manic symptoms.  Continue psychotherapy  60 min appt  FU 4-6 weeks for 30 mins  Call if any problems with the meds.  Lauraine Rinne, MD

## 2018-08-22 ENCOUNTER — Other Ambulatory Visit: Payer: Self-pay

## 2018-08-22 ENCOUNTER — Telehealth: Payer: Self-pay | Admitting: Psychiatry

## 2018-08-22 MED ORDER — BREXPIPRAZOLE 2 MG PO TABS: 2.0000 mg | ORAL_TABLET | Freq: Every day | ORAL | 1 refills | Status: DC

## 2018-08-22 NOTE — Telephone Encounter (Signed)
rx submitted to [facility] [address] per request

## 2018-08-22 NOTE — Telephone Encounter (Signed)
[name] called to request a prescription for Rexulti 2mg .  She is going out of town Sunday so would like it filled before the weekend.  Send to [facility] [address] [place].

## 2018-08-26 ENCOUNTER — Telehealth: Payer: Self-pay

## 2018-08-26 NOTE — Telephone Encounter (Signed)
Prior authorization submitted for Rexulti through [facility] approved effective 08/26/2018-08/25/2021   [facility] [place] faxed [phone]

## 2018-09-30 ENCOUNTER — Ambulatory Visit (INDEPENDENT_AMBULATORY_CARE_PROVIDER_SITE_OTHER): Payer: BC Managed Care – PPO | Admitting: Psychiatry

## 2018-09-30 ENCOUNTER — Encounter: Payer: Self-pay | Admitting: Psychiatry

## 2018-09-30 ENCOUNTER — Other Ambulatory Visit: Payer: Self-pay

## 2018-09-30 DIAGNOSIS — F411 Generalized anxiety disorder: Secondary | ICD-10-CM | POA: Diagnosis not present

## 2018-09-30 DIAGNOSIS — F4001 Agoraphobia with panic disorder: Secondary | ICD-10-CM | POA: Diagnosis not present

## 2018-09-30 DIAGNOSIS — F3162 Bipolar disorder, current episode mixed, moderate: Secondary | ICD-10-CM | POA: Diagnosis not present

## 2018-09-30 NOTE — Progress Notes (Signed)
Kirsten BurnReagan R Ace [id number] 1990-09-15 28 y.o.  Subjective:   Patient ID:  [name] is a 28 y.o. (DOB 1990-09-15) female.  Chief Complaint:  Chief Complaint  Patient presents with  . Follow-up    Medication Management  . Depression    HPI Kirsten BurnReagan R Bezdek presents to the office today for follow-up of newly diagnosed bipolar disorder mixed, panic disorder, generalized anxiety disorder at her initial office visit August 19, 2018  At that visit fluoxetine was changed to sertraline for better antianxiety effect and increased to 100 mg daily, Abilify was stopped because of akathisia and Rexulti was started off label for mood stabilization at 1 mg to be increased to 2 mg daily.  Different bc life stopped.  Hard to tell.  Still depressed but inside all day every day.  Working at home.  Can tell it does help.  Less repetitive thoughts after gets going in the morning.  Keeping up with yoga, coloring and journaling.  If pushes herself she can roll on from there.  No panic attacks.  Sleeping a lot but in house with father who's positive for Covid 19.  Testing today.  Parents getting better from it.  Irritability definitely better but not a lot of triggers.  Feels handling the stress better than before.   Past Psychiatric Medication Trials: Fluoxetine 40 with Abilify 5 mg akathisia, Lexapro increased anxiety for 3 weeks.  Review of Systems:  Review of Systems  Constitutional: Positive for fatigue.  Musculoskeletal: Positive for myalgias.  Neurological: Positive for weakness. Negative for tremors.  Psychiatric/Behavioral: Positive for dysphoric mood and sleep disturbance. Negative for agitation, behavioral problems, confusion, decreased concentration, hallucinations, self-injury and suicidal ideas. The patient is not nervous/anxious and is not hyperactive.     Medications: I have reviewed the patient's current medications.  Current Outpatient Medications  Medication Sig Dispense Refill   . Brexpiprazole (REXULTI) 2 MG TABS Take 2 mg by mouth daily. 30 tablet 1  . MULTIPLE VITAMIN PO Take 1 tablet by mouth daily.    . sertraline (ZOLOFT) 100 MG tablet 1/2 daily for 1 week, then 1 each day. (Patient taking differently: Take 100 mg by mouth. ) 30 tablet 1   No current facility-administered medications for this visit.     Medication Side Effects: Other: no restlessness like abilify.  Allergies: No Known Allergies  Past Medical History:  Diagnosis Date  . Chicken pox   . Headache(784.0)     Family History  Problem Relation Age of Onset  . Hypertension Father   . Hyperlipidemia Father   . Diabetes Maternal Grandmother        type ll  . Diabetes Maternal Grandfather        type ll  . Stroke Paternal Grandmother   . Hypertension Paternal Grandmother   . Cancer Paternal Grandfather        prostate  . Hyperlipidemia Paternal Grandfather     Social History   Socioeconomic History  . Marital status: Single    Spouse name: Not on file  . Number of children: Not on file  . Years of education: Not on file  . Highest education level: Not on file  Occupational History  . Not on file  Social Needs  . Financial resource strain: Not on file  . Food insecurity:    Worry: Not on file    Inability: Not on file  . Transportation needs:    Medical: Not on file    Non-medical: Not on  file  Tobacco Use  . Smoking status: Never Smoker  . Smokeless tobacco: Never Used  Substance and Sexual Activity  . Alcohol use: Yes    Alcohol/week: 10.0 - 11.0 standard drinks    Types: 6 - 7 Cans of beer, 4 Glasses of wine per week    Comment: Trying to cut back  . Drug use: Yes    Types: Marijuana  . Sexual activity: Not on file  Lifestyle  . Physical activity:    Days per week: Not on file    Minutes per session: Not on file  . Stress: Not on file  Relationships  . Social connections:    Talks on phone: Not on file    Gets together: Not on file    Attends religious  service: Not on file    Active member of club or organization: Not on file    Attends meetings of clubs or organizations: Not on file    Relationship status: Not on file  . Intimate partner violence:    Fear of current or ex partner: Not on file    Emotionally abused: Not on file    Physically abused: Not on file    Forced sexual activity: Not on file  Other Topics Concern  . Not on file  Social History Narrative  . Not on file    Past Medical History, Surgical history, Social history, and Family history were reviewed and updated as appropriate.   Please see review of systems for further details on the patient's review from today.   Objective:   Physical Exam:  There were no vitals taken for this visit.  Physical Exam Neurological:     Mental Status: She is alert and oriented to person, place, and time.     Cranial Nerves: No dysarthria.  Psychiatric:        Attention and Perception: Attention normal.        Mood and Affect: Mood is depressed. Mood is not anxious.        Speech: Speech normal.        Behavior: Behavior is cooperative.        Thought Content: Thought content normal. Thought content is not paranoid or delusional. Thought content does not include homicidal or suicidal ideation. Thought content does not include homicidal or suicidal plan.        Cognition and Memory: Cognition and memory normal.        Judgment: Judgment normal.     Comments: insihgt good.     Lab Review:  No results found for: NA, K, CL, CO2, GLUCOSE, BUN, CREATININE, CALCIUM, PROT, ALBUMIN, AST, ALT, ALKPHOS, BILITOT, GFRNONAA, GFRAA     Component Value Date/Time   HGB 14.7 12/29/2010 1330    No results found for: POCLITH, LITHIUM   No results found for: PHENYTOIN, PHENOBARB, VALPROATE, CBMZ   .res Assessment: Plan:    Bipolar 1 disorder, mixed, moderate (HCC)  Panic disorder with agoraphobia  Generalized anxiety disorder   Recently diagnosed bipolar disorder mixed.  She  clearly notices improvement in depression and probably an anxiety with the med changes.  She is not more manic.  She is less irritable.  However it is not difficult to entirely judge the effect of medication because of the Covid virus and so affected her routine in addition her parents have developed the Covid virus and she is getting early symptoms herself.  Discussed potential metabolic side effects associated with atypical antipsychotics, as well as potential risk  for movement side effects. Advised pt to contact office if movement side effects occur.   We will make no med changes today.  She may get further improvement from the changes that were made last visit.  She agrees with this plan.  Call if there is any development of manic symptoms or worsening mood swings.  Follow-up 6 weeks  I connected with patient by a video enabled telemedicine application or telephone, with their informed consent, and verified patient privacy and that I am speaking with the correct person using two identifiers.  I was located at office and patient at home.  Meredith Staggers, MD, DFAPA  Please see After Visit Summary for patient specific instructions.  No future appointments.  No orders of the defined types were placed in this encounter.     -------------------------------

## 2018-10-02 ENCOUNTER — Encounter: Payer: Self-pay | Admitting: Family Medicine

## 2018-10-03 ENCOUNTER — Telehealth: Payer: Self-pay

## 2018-10-03 NOTE — Telephone Encounter (Signed)
Called patient and left a detailed voice message to see how patient was feeling.  OK for [facility] to discuss/advise/schedule patient if need be.  CRM Created.

## 2018-10-10 ENCOUNTER — Other Ambulatory Visit: Payer: Self-pay | Admitting: Psychiatry

## 2018-10-11 ENCOUNTER — Other Ambulatory Visit: Payer: Self-pay

## 2018-10-11 ENCOUNTER — Ambulatory Visit (INDEPENDENT_AMBULATORY_CARE_PROVIDER_SITE_OTHER): Payer: BC Managed Care – PPO | Admitting: Family Medicine

## 2018-10-11 DIAGNOSIS — H9202 Otalgia, left ear: Secondary | ICD-10-CM

## 2018-10-11 MED ORDER — NEOMYCIN-POLYMYXIN-HC 3.5-10000-1 OT SUSP: 3.0000 [drp] | Freq: Four times a day (QID) | OTIC | 0 refills | Status: DC

## 2018-10-11 NOTE — Progress Notes (Signed)
Patient ID: [name], female   DOB: Nov 24, 1990, 28 y.o.   MRN: [id number]  This visit type was conducted due to national recommendations for restrictions regarding the COVID-19 pandemic in an effort to limit this patient's exposure and mitigate transmission in our community.   Virtual Visit via Video Note  I connected with Pamala Sequeira on 10/11/18 at  9:30 AM EDT by a video enabled telemedicine application and verified that I am speaking with the correct person using two identifiers.  Location patient: home Location provider:work or home office Persons participating in the virtual visit: patient, provider  I discussed the limitations of evaluation and management by telemedicine and the availability of in person appointments. The patient expressed understanding and agreed to proceed.   HPI: Patient relates couple day history of left ear discomfort.  This is involving the external canal.  She does sometimes use Q-tips.  Denies any trauma.  No recent swimming.  No drainage.  Denies any sore throat or fever.  She does have a little bit of itching and she states this feels slightly swollen.  No dizziness.  No hearing loss.   ROS: See pertinent positives and negatives per HPI.  Past Medical History:  Diagnosis Date  . Chicken pox   . [id number](194.1)     Past Surgical History:  Procedure Laterality Date  . BREAST REDUCTION SURGERY  2012    Family History  Problem Relation Age of Onset  . Hypertension Father   . Hyperlipidemia Father   . Diabetes Maternal Grandmother        type ll  . Diabetes Maternal Grandfather        type ll  . Stroke Paternal Grandmother   . Hypertension Paternal Grandmother   . Cancer Paternal Grandfather        prostate  . Hyperlipidemia Paternal Grandfather     SOCIAL HX: Non-smoker   Current Outpatient Medications:  .  Brexpiprazole (REXULTI) 2 MG TABS, Take 2 mg by mouth daily., Disp: 30 tablet, Rfl: 1 .  MULTIPLE VITAMIN PO, Take 1 tablet  by mouth daily., Disp: , Rfl:  .  sertraline (ZOLOFT) 100 MG tablet, Take 1 tablet daily, Disp: 30 tablet, Rfl: 2  EXAM:  VITALS per patient if applicable:  GENERAL: alert, oriented, appears well and in no acute distress  HEENT: atraumatic, conjunttiva clear, no obvious abnormalities on inspection of external nose and ears  NECK: normal movements of the head and neck  LUNGS: on inspection no signs of respiratory distress, breathing rate appears normal, no obvious gross SOB, gasping or wheezing  CV: no obvious cyanosis  MS: moves all visible extremities without noticeable abnormality  PSYCH/NEURO: pleasant and cooperative, no obvious depression or anxiety, speech and thought processing grossly intact  ASSESSMENT AND PLAN:  Discussed the following assessment and plan:  Left ear pain-by description sounds like she may have otitis externa.  She clearly has some discomfort with movement of the left ear.  We cannot see any visible changes on exam by video monitor  -Ear dry as possible -Cortisporin otic suspension 3 to 4 drops left ear 4 times daily -Recommend office follow-up to further assess by next week if not improving     I discussed the assessment and treatment plan with the patient. The patient was provided an opportunity to ask questions and all were answered. The patient agreed with the plan and demonstrated an understanding of the instructions.   The patient was advised to call back or seek an  in-person evaluation if the symptoms worsen or if the condition fails to improve as anticipated   Evelena PeatBruce Yesha Muchow, MD

## 2018-10-25 ENCOUNTER — Other Ambulatory Visit: Payer: BC Managed Care – PPO

## 2018-10-28 ENCOUNTER — Other Ambulatory Visit: Payer: Self-pay | Admitting: Psychiatry

## 2018-11-01 ENCOUNTER — Other Ambulatory Visit: Payer: Self-pay

## 2018-11-01 ENCOUNTER — Other Ambulatory Visit: Payer: Self-pay | Admitting: Obstetrics & Gynecology

## 2018-11-01 ENCOUNTER — Ambulatory Visit
Admission: RE | Admit: 2018-11-01 | Discharge: 2018-11-01 | Disposition: A | Discharge status: Home / Self Care | Payer: BC Managed Care – PPO | Source: Ambulatory Visit | Attending: Obstetrics & Gynecology | Admitting: Obstetrics & Gynecology

## 2018-11-01 DIAGNOSIS — N6310 Unspecified lump in the right breast, unspecified quadrant: Secondary | ICD-10-CM

## 2018-11-01 DIAGNOSIS — N6320 Unspecified lump in the left breast, unspecified quadrant: Secondary | ICD-10-CM

## 2018-11-01 DIAGNOSIS — N6489 Other specified disorders of breast: Secondary | ICD-10-CM

## 2018-12-27 ENCOUNTER — Other Ambulatory Visit: Payer: Self-pay | Admitting: Psychiatry

## 2018-12-31 ENCOUNTER — Other Ambulatory Visit: Payer: Self-pay | Admitting: Psychiatry

## 2019-02-27 ENCOUNTER — Other Ambulatory Visit: Payer: Self-pay | Admitting: Psychiatry

## 2019-03-23 ENCOUNTER — Other Ambulatory Visit: Payer: Self-pay | Admitting: Psychiatry

## 2019-03-24 NOTE — Telephone Encounter (Signed)
Call to schedule him so that we can get him his refills.

## 2019-03-24 NOTE — Telephone Encounter (Signed)
Still hasn't scheduled appt, last visit 09/2018

## 2019-03-31 NOTE — Telephone Encounter (Signed)
Regan to see why her medication was not being filled.  She made appt. 10/15.  Please call in refill on her Sertraline. [facility] [address]

## 2019-04-03 ENCOUNTER — Encounter: Payer: Self-pay | Admitting: Psychiatry

## 2019-04-03 ENCOUNTER — Other Ambulatory Visit: Payer: Self-pay

## 2019-04-03 ENCOUNTER — Ambulatory Visit (INDEPENDENT_AMBULATORY_CARE_PROVIDER_SITE_OTHER): Payer: BC Managed Care – PPO | Admitting: Psychiatry

## 2019-04-03 DIAGNOSIS — F3162 Bipolar disorder, current episode mixed, moderate: Secondary | ICD-10-CM | POA: Diagnosis not present

## 2019-04-03 DIAGNOSIS — F411 Generalized anxiety disorder: Secondary | ICD-10-CM

## 2019-04-03 DIAGNOSIS — F4001 Agoraphobia with panic disorder: Secondary | ICD-10-CM

## 2019-04-03 MED ORDER — SERTRALINE HCL 100 MG PO TABS: 150.0000 mg | ORAL_TABLET | Freq: Every day | ORAL | 1 refills | Status: DC

## 2019-04-03 MED ORDER — BREXPIPRAZOLE 2 MG PO TABS: 2.0000 mg | ORAL_TABLET | Freq: Every day | ORAL | 1 refills | Status: DC

## 2019-04-03 NOTE — Progress Notes (Signed)
[name] [id number] 11/15/1990 28 y.o.  Subjective:   Patient ID:  [name] is a 28 y.o. (DOB 07/09/1990) female.  Chief Complaint:  Chief Complaint  Patient presents with  . Follow-up    Medication Management    HPI [name] presents to the office today for follow-up of newly diagnosed bipolar disorder mixed, panic disorder, generalized anxiety disorder at her initial office visit August 19, 2018  At that visit fluoxetine was changed to sertraline for better antianxiety effect and increased to 100 mg daily, Abilify was stopped because of akathisia and Rexulti was started off label for mood stabilization at 1 mg to be increased to 2 mg daily.  Last visit April 2020.  No meds were changed.  But because of recent med changes she was encouraged to come back in 6 weeks.  That did not happen.  Consistent with meds since April except missing a week.  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  Anxiety is manageable.  Mood still depressed but better with a schedule.  No manic sx and mood stabilization is better with typically one worse day/week.  No missed work DT depression.  Sleep 8 hour weekdays and 10-12 hours on weekend.  Always needed a lot of sleep.  No anger problems.  Irritable better.  Past Psychiatric Medication Trials: Fluoxetine 40 with Abilify 5 mg akathisia, Lexapro increased anxiety for 3 weeks.  Review of Systems:  Review of Systems  Constitutional: Negative for fatigue.  Musculoskeletal: Negative for myalgias.  Neurological: Negative for tremors and weakness.  Psychiatric/Behavioral: Positive for dysphoric mood. Negative for agitation, behavioral problems, confusion, decreased concentration, hallucinations, self-injury, sleep disturbance and suicidal ideas. The patient is not nervous/anxious and is not hyperactive.     Medications: I have reviewed the patient's current medications.  Current Outpatient Medications  Medication Sig  Dispense Refill  . MULTIPLE VITAMIN PO Take 1 tablet by mouth daily.    Marland Kitchen REXULTI 2 MG TABS tablet TAKE 1 TABLET BY MOUTH DAILY 30 tablet 0  . sertraline (ZOLOFT) 100 MG tablet TAKE 1 TABLET BY MOUTH DAILY. NEED TO SCHEDULE APPOINTMENT 15 tablet 0   No current facility-administered medications for this visit.     Medication Side Effects: Other: no restlessness like abilify.  Allergies: No Known Allergies  Past Medical History:  Diagnosis Date  . Chicken pox   . Headache(784.0)     Family History  Problem Relation Age of Onset  . Hypertension Father   . Hyperlipidemia Father   . Diabetes Maternal Grandmother        type ll  . Diabetes Maternal Grandfather        type ll  . Stroke Paternal Grandmother   . Hypertension Paternal Grandmother   . Breast cancer Paternal Grandmother        postmenopausal  . Cancer Paternal Grandfather        prostate  . Hyperlipidemia Paternal Grandfather     Social History   Socioeconomic History  . Marital status: Single    Spouse name: Not on file  . Number of children: Not on file  . Years of education: Not on file  . Highest education level: Not on file  Occupational History  . Not on file  Social Needs  . Financial resource strain: Not on file  . Food insecurity    Worry: Not on file    Inability: Not on file  . Transportation needs    Medical: Not  on file    Non-medical: Not on file  Tobacco Use  . Smoking status: Never Smoker  . Smokeless tobacco: Never Used  Substance and Sexual Activity  . Alcohol use: Yes    Alcohol/week: 10.0 - 11.0 standard drinks    Types: 6 - 7 Cans of beer, 4 Glasses of wine per week    Comment: Trying to cut back  . Drug use: Yes    Types: Marijuana  . Sexual activity: Not on file  Lifestyle  . Physical activity    Days per week: Not on file    Minutes per session: Not on file  . Stress: Not on file  Relationships  . Social Musicianconnections    Talks on phone: Not on file    Gets together:  Not on file    Attends religious service: Not on file    Active member of club or organization: Not on file    Attends meetings of clubs or organizations: Not on file    Relationship status: Not on file  . Intimate partner violence    Fear of current or ex partner: Not on file    Emotionally abused: Not on file    Physically abused: Not on file    Forced sexual activity: Not on file  Other Topics Concern  . Not on file  Social History Narrative  . Not on file    Past Medical History, Surgical history, Social history, and Family history were reviewed and updated as appropriate.   Please see review of systems for further details on the patient's review from today.   Objective:   Physical Exam:  There were no vitals taken for this visit.  Physical Exam Neurological:     Mental Status: She is alert and oriented to person, place, and time.     Cranial Nerves: No dysarthria.  Psychiatric:        Attention and Perception: Attention normal.        Mood and Affect: Mood is depressed. Mood is not anxious.        Speech: Speech normal.        Behavior: Behavior is cooperative.        Thought Content: Thought content normal. Thought content is not paranoid or delusional. Thought content does not include homicidal or suicidal ideation. Thought content does not include homicidal or suicidal plan.        Cognition and Memory: Cognition and memory normal.        Judgment: Judgment normal.     Comments: insihgt good.     Lab Review:  No results found for: NA, K, CL, CO2, GLUCOSE, BUN, CREATININE, CALCIUM, PROT, ALBUMIN, AST, ALT, ALKPHOS, BILITOT, GFRNONAA, GFRAA     Component Value Date/Time   HGB 14.7 12/29/2010 1330    No results found for: POCLITH, LITHIUM   No results found for: PHENYTOIN, PHENOBARB, VALPROATE, CBMZ   .res Assessment: Plan:    Bipolar 1 disorder, mixed, moderate ([facility])  Panic disorder with agoraphobia  Generalized anxiety disorder   Recently diagnosed  bipolar disorder mixed.  She clearly notices improvement in depression and probably an anxiety with the med changes.  She is not more manic.  She is less irritable.  Since she was last here in April.  She had worsening depression in the summer it has somewhat improved but not resolved.  She would like a med change to try to help with the residual depression and anxiety.  Discussed potential metabolic side effects  associated with atypical antipsychotics, as well as potential risk for movement side effects. Advised pt to contact office if movement side effects occur.   On normal dosage of Rexulti 2 mg.  She wants trial of increase sertraline 150 mg daily.  Discussed the risk that this can cause mania or rapid cycling.  She wants to try it in hopes of improving the depression and lowering the anxiety a little bit further.  Call if there is any development of manic symptoms or worsening mood swings.  Follow-up 6 weeks  Meredith Staggers, MD, DFAPA  Please see After Visit Summary for patient specific instructions.  Future Appointments  Date Time Provider Department Center  05/07/2019  3:30 PM [facility]    No orders of the defined types were placed in this encounter.     -------------------------------

## 2019-04-28 ENCOUNTER — Other Ambulatory Visit: Payer: Self-pay | Admitting: Psychiatry

## 2019-04-28 DIAGNOSIS — F3162 Bipolar disorder, current episode mixed, moderate: Secondary | ICD-10-CM

## 2019-05-07 ENCOUNTER — Other Ambulatory Visit: Payer: Self-pay

## 2019-05-07 ENCOUNTER — Ambulatory Visit
Admission: RE | Admit: 2019-05-07 | Discharge: 2019-05-07 | Disposition: A | Discharge status: Home / Self Care | Payer: BC Managed Care – PPO | Source: Ambulatory Visit | Attending: Obstetrics & Gynecology | Admitting: Obstetrics & Gynecology

## 2019-05-07 DIAGNOSIS — N6489 Other specified disorders of breast: Secondary | ICD-10-CM

## 2019-06-19 ENCOUNTER — Other Ambulatory Visit: Payer: Self-pay | Admitting: Psychiatry

## 2019-06-19 DIAGNOSIS — F4001 Agoraphobia with panic disorder: Secondary | ICD-10-CM

## 2019-06-19 DIAGNOSIS — F411 Generalized anxiety disorder: Secondary | ICD-10-CM

## 2019-06-23 ENCOUNTER — Encounter: Payer: Self-pay | Admitting: Family Medicine

## 2019-07-28 ENCOUNTER — Other Ambulatory Visit: Payer: Self-pay | Admitting: Psychiatry

## 2019-07-28 DIAGNOSIS — F411 Generalized anxiety disorder: Secondary | ICD-10-CM

## 2019-07-28 DIAGNOSIS — F4001 Agoraphobia with panic disorder: Secondary | ICD-10-CM

## 2019-07-29 NOTE — Telephone Encounter (Signed)
Last apt 04/03/2019, hasn't scheduled f/u 

## 2019-07-29 NOTE — Telephone Encounter (Signed)
Last apt 04/03/2019, hasn't scheduled f/u

## 2019-07-29 NOTE — Telephone Encounter (Signed)
Call to reschedule him 

## 2019-07-30 NOTE — Telephone Encounter (Signed)
Unable to leave a message for patient to schedule. The voicemail was full

## 2019-08-28 ENCOUNTER — Encounter: Payer: Self-pay | Admitting: Psychiatry

## 2019-08-28 ENCOUNTER — Ambulatory Visit (INDEPENDENT_AMBULATORY_CARE_PROVIDER_SITE_OTHER): Payer: BC Managed Care – PPO | Admitting: Psychiatry

## 2019-08-28 DIAGNOSIS — F411 Generalized anxiety disorder: Secondary | ICD-10-CM | POA: Diagnosis not present

## 2019-08-28 DIAGNOSIS — F3162 Bipolar disorder, current episode mixed, moderate: Secondary | ICD-10-CM

## 2019-08-28 DIAGNOSIS — F4001 Agoraphobia with panic disorder: Secondary | ICD-10-CM | POA: Diagnosis not present

## 2019-08-28 MED ORDER — BREXPIPRAZOLE 2 MG PO TABS: 2.0000 mg | ORAL_TABLET | Freq: Every day | ORAL | 3 refills | Status: DC

## 2019-08-28 MED ORDER — SERTRALINE HCL 100 MG PO TABS: 150.0000 mg | ORAL_TABLET | Freq: Every day | ORAL | 1 refills | Status: DC

## 2019-08-28 NOTE — Progress Notes (Signed)
[name] [id number] 10-Jan-1991 29 y.o.  Virtual Visit via WebEX  I connected with pt by WebEx and verified that I am speaking with the correct person using two identifiers.   I discussed the limitations, risks, security and privacy concerns of performing an evaluation and management service by [facility] and the availability of in person appointments. I also discussed with the patient that there may be a patient responsible charge related to this service. The patient expressed understanding and agreed to proceed.  I discussed the assessment and treatment plan with the patient. The patient was provided an opportunity to ask questions and all were answered. The patient agreed with the plan and demonstrated an understanding of the instructions.   The patient was advised to call back or seek an in-person evaluation if the symptoms worsen or if the condition fails to improve as anticipated.  I provided 30 minutes of video time during this encounter. The call started at 1200 and ended at 12:30. The patient was located at home and the provider was located office.  Subjective:   Patient ID:  [name] is a 29 y.o. (DOB 1991-05-11) female.  Chief Complaint:  Chief Complaint  Patient presents with  . Follow-up    Medication Management  . Depression  . Anxiety    HPI [name] presents to the office today for follow-up of newly diagnosed bipolar disorder mixed, panic disorder, generalized anxiety disorder at her initial office visit August 19, 2018  At that visit fluoxetine was changed to sertraline for better antianxiety effect and increased to 100 mg daily, Abilify was stopped because of akathisia and Rexulti was started off label for mood stabilization at 1 mg to be increased to 2 mg daily.   visit April 2020.  No meds were changed.  But because of recent med changes she was encouraged to come back in 6 weeks.  That did not happen. Consistent with meds since April except  missing a week.  Last visit October 2020.  She asked for trial of increasing sertraline to 150 mg to further help depression.  She continued Rexulti 2 mg.  She was to follow-up in 6 weeks but did not do so.  Really welll with extra sertraline.  Helped a lot.  Depression now under control.  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  Anxiety is manageable.  No manic sx and mood stabilization is better with typically one worse day/week.  No missed work DT depression.  Sleep 8 hour weekdays and less excessive on weekend.  Always needed a lot of sleep.  No anger problems.  Irritable better.  Past Psychiatric Medication Trials: Fluoxetine 40 with Abilify 5 mg akathisia, Lexapro increased anxiety for 3 weeks. Zoloft 150 with Rexulti 2 mg good Last significant manic episode mid 2018.  History of a lot of mixed symptoms.  Review of Systems:  Review of Systems  Constitutional: Negative for fatigue.  Musculoskeletal: Negative for myalgias.  Neurological: Negative for dizziness, tremors and weakness.  Psychiatric/Behavioral: Negative for agitation, behavioral problems, confusion, decreased concentration, dysphoric mood, hallucinations, self-injury, sleep disturbance and suicidal ideas. The patient is not nervous/anxious and is not hyperactive.     Medications: I have reviewed the patient's current medications.  Current Outpatient Medications  Medication Sig Dispense Refill  . brexpiprazole (REXULTI) 2 MG TABS tablet Take 1 tablet (2 mg total) by mouth daily. 30 tablet 3  . MULTIPLE VITAMIN PO Take  1 tablet by mouth daily.    . sertraline (ZOLOFT) 100 MG tablet Take 1.5 tablets (150 mg total) by mouth daily. 135 tablet 1   No current facility-administered medications for this visit.    Medication Side Effects: Other: no restlessness like abilify.  Allergies: No Known Allergies  Past Medical History:  Diagnosis Date  . Chicken pox   .  Headache(784.0)     Family History  Problem Relation Age of Onset  . Hypertension Father   . Hyperlipidemia Father   . Diabetes Maternal Grandmother        type ll  . Diabetes Maternal Grandfather        type ll  . Stroke Paternal Grandmother   . Hypertension Paternal Grandmother   . Breast cancer Paternal Grandmother        postmenopausal  . Cancer Paternal Grandfather        prostate  . Hyperlipidemia Paternal Grandfather     Social History   Socioeconomic History  . Marital status: Single    Spouse name: Not on file  . Number of children: Not on file  . Years of education: Not on file  . Highest education level: Not on file  Occupational History  . Not on file  Tobacco Use  . Smoking status: Never Smoker  . Smokeless tobacco: Never Used  Substance and Sexual Activity  . Alcohol use: Yes    Alcohol/week: 10.0 - 11.0 standard drinks    Types: 6 - 7 Cans of beer, 4 Glasses of wine per week    Comment: Trying to cut back  . Drug use: Yes    Types: Marijuana  . Sexual activity: Not on file  Other Topics Concern  . Not on file  Social History Narrative  . Not on file   Social Determinants of Health   Financial Resource Strain:   . Difficulty of Paying Living Expenses:   Food Insecurity:   . Worried About Programme researcher, broadcasting/film/video in the Last Year:   . Barista in the Last Year:   Transportation Needs:   . Freight forwarder (Medical):   Marland Kitchen Lack of Transportation (Non-Medical):   Physical Activity:   . Days of Exercise per Week:   . Minutes of Exercise per Session:   Stress:   . Feeling of Stress :   Social Connections:   . Frequency of Communication with Friends and Family:   . Frequency of Social Gatherings with Friends and Family:   . Attends Religious Services:   . Active Member of Clubs or Organizations:   . Attends Banker Meetings:   Marland Kitchen Marital Status:   Intimate Partner Violence:   . Fear of Current or Ex-Partner:   .  Emotionally Abused:   Marland Kitchen Physically Abused:   . Sexually Abused:     Past Medical History, Surgical history, Social history, and Family history were reviewed and updated as appropriate.   Please see review of systems for further details on the patient's review from today.   Objective:   Physical Exam:  There were no vitals taken for this visit.  Physical Exam Neurological:     Mental Status: She is alert and oriented to person, place, and time.     Cranial Nerves: No dysarthria.  Psychiatric:        Attention and Perception: Attention normal.        Mood and Affect: Mood is not anxious or depressed.  Speech: Speech normal.        Behavior: Behavior is cooperative.        Thought Content: Thought content normal. Thought content is not paranoid or delusional. Thought content does not include homicidal or suicidal ideation. Thought content does not include homicidal or suicidal plan.        Cognition and Memory: Cognition and memory normal.        Judgment: Judgment normal.     Comments: insight good.     Lab Review:  No results found for: NA, K, CL, CO2, GLUCOSE, BUN, CREATININE, CALCIUM, PROT, ALBUMIN, AST, ALT, ALKPHOS, BILITOT, GFRNONAA, GFRAA     Component Value Date/Time   HGB 14.7 12/29/2010 1330    No results found for: POCLITH, LITHIUM   No results found for: PHENYTOIN, PHENOBARB, VALPROATE, CBMZ   .res Assessment: Plan:    Bipolar 1 disorder, mixed, moderate ([facility]) - Plan: brexpiprazole (REXULTI) 2 MG TABS tablet  Panic disorder with agoraphobia - Plan: sertraline (ZOLOFT) 100 MG tablet  Generalized anxiety disorder - Plan: sertraline (ZOLOFT) 100 MG tablet   Recently diagnosed bipolar disorder mixed.  She clearly notices improvement in depression and probably an anxiety with the med changes.  She is not more manic.  She is less irritable.  Since she was last here in April.  She had worsening depression in the summer it has somewhat improved but resolved  with the increase in sertraline 150 mg daily.  Anxiety is generally under control as well. Discussed the risk that this can cause mania or rapid cycling.   .   Discussed potential metabolic side effects associated with atypical antipsychotics, as well as potential risk for movement side effects. Advised pt to contact office if movement side effects occur.   On normal dosage of Rexulti 2 mg.  This is being used off label as a mood stabilizer.  So far it is been effective.  Call if there is any development of manic symptoms or worsening mood swings.  Follow-up 4 mos  Lynder Parents, MD, DFAPA  Please see After Visit Summary for patient specific instructions.  No future appointments.  No orders of the defined types were placed in this encounter.     -------------------------------

## 2019-12-19 ENCOUNTER — Telehealth: Payer: Self-pay | Admitting: Psychiatry

## 2019-12-19 ENCOUNTER — Other Ambulatory Visit: Payer: Self-pay

## 2019-12-19 DIAGNOSIS — F4001 Agoraphobia with panic disorder: Secondary | ICD-10-CM

## 2019-12-19 DIAGNOSIS — F411 Generalized anxiety disorder: Secondary | ICD-10-CM

## 2019-12-19 DIAGNOSIS — F3162 Bipolar disorder, current episode mixed, moderate: Secondary | ICD-10-CM

## 2019-12-19 MED ORDER — SERTRALINE HCL 100 MG PO TABS: 150.0000 mg | ORAL_TABLET | Freq: Every day | ORAL | 0 refills | Status: DC

## 2019-12-19 MED ORDER — BREXPIPRAZOLE 2 MG PO TABS
2.0000 mg | ORAL_TABLET | Freq: Every day | ORAL | 1 refills | Status: DC
Start: 2019-12-19 — End: 2020-04-15

## 2019-12-19 NOTE — Telephone Encounter (Signed)
Refills sent

## 2019-12-19 NOTE — Telephone Encounter (Signed)
Pt requesting refill for Rexulti and Sertraline @ [facility]   [place] location [address]

## 2019-12-24 IMAGING — US ULTRASOUND RIGHT BREAST LIMITED
1 series · 7 of 7 positions shown · non-contrast
Comparison: None

CLINICAL DATA: Patient presents for palpable abnormality within the
right breast. Patient has history of right breast reduction surgery.

EXAM:
DIGITAL DIAGNOSTIC BILATERAL MAMMOGRAM WITH CAD AND TOMO
ULTRASOUND RIGHT BREAST

[Series 1: ultrasound right breast limited · 0.09mm/px · 7 of 7 slices shown]
[im 1/7]
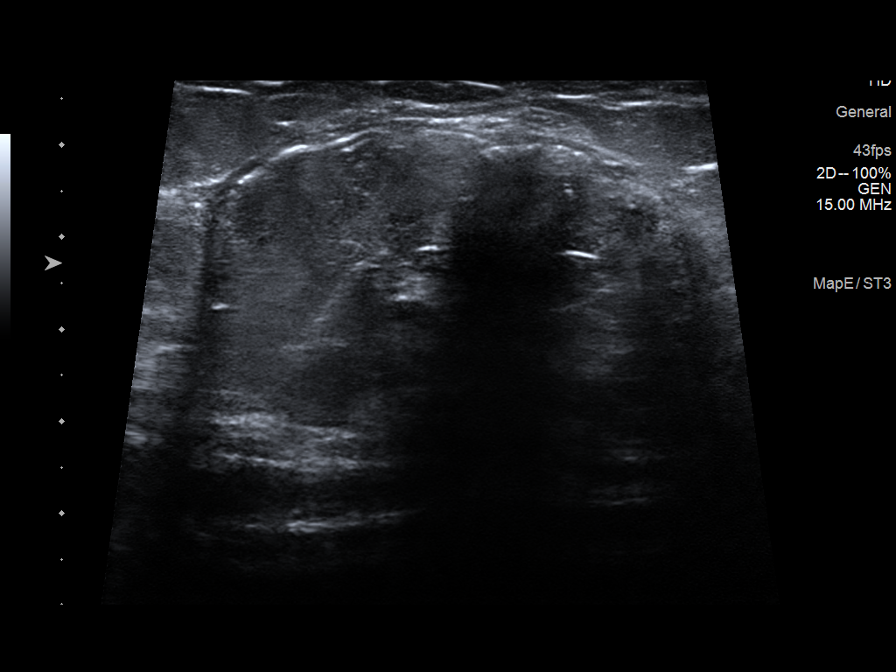
[im 2/7]
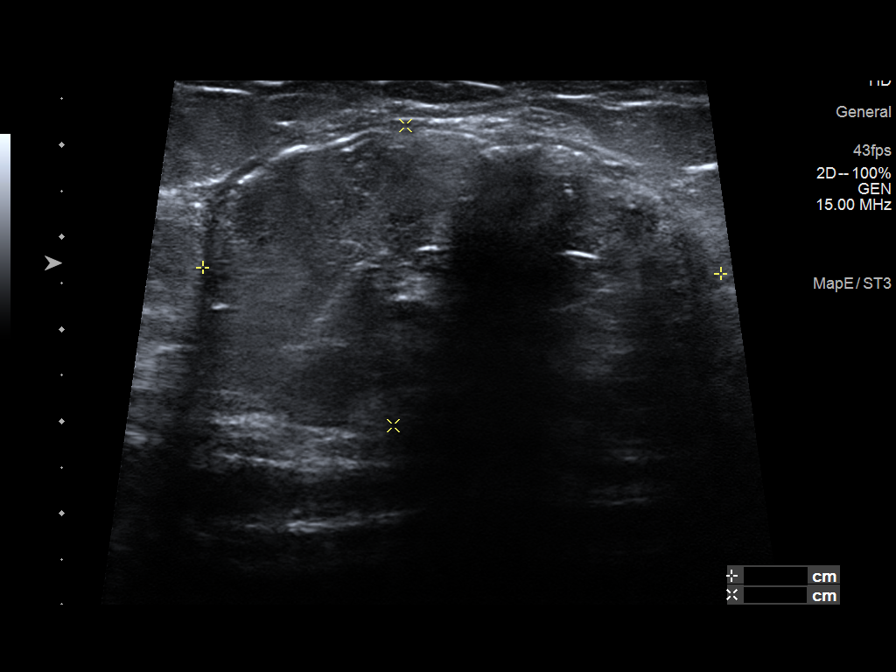
[im 3/7]
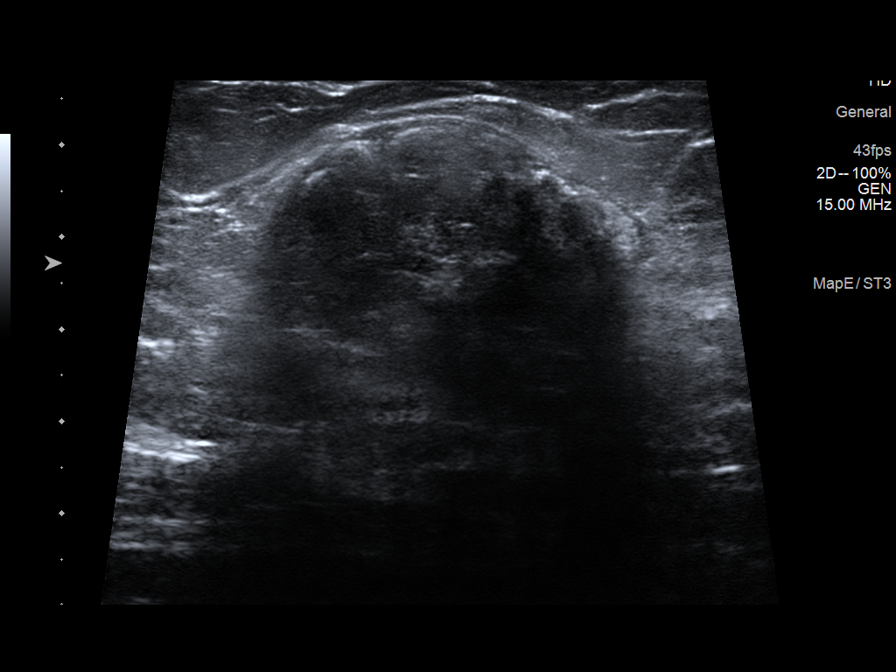
[im 4/7]
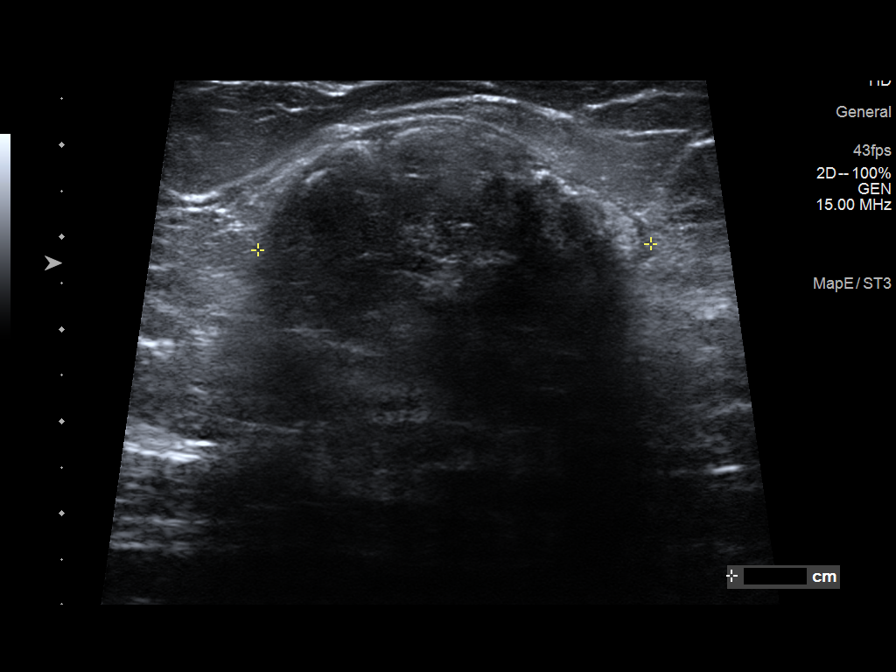
[im 5/7]
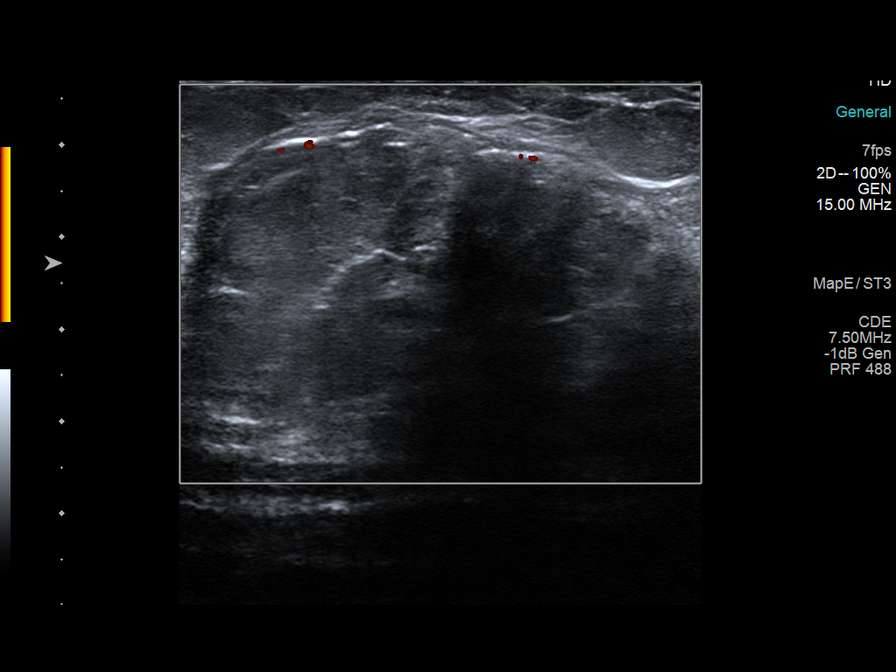
[im 6/7]
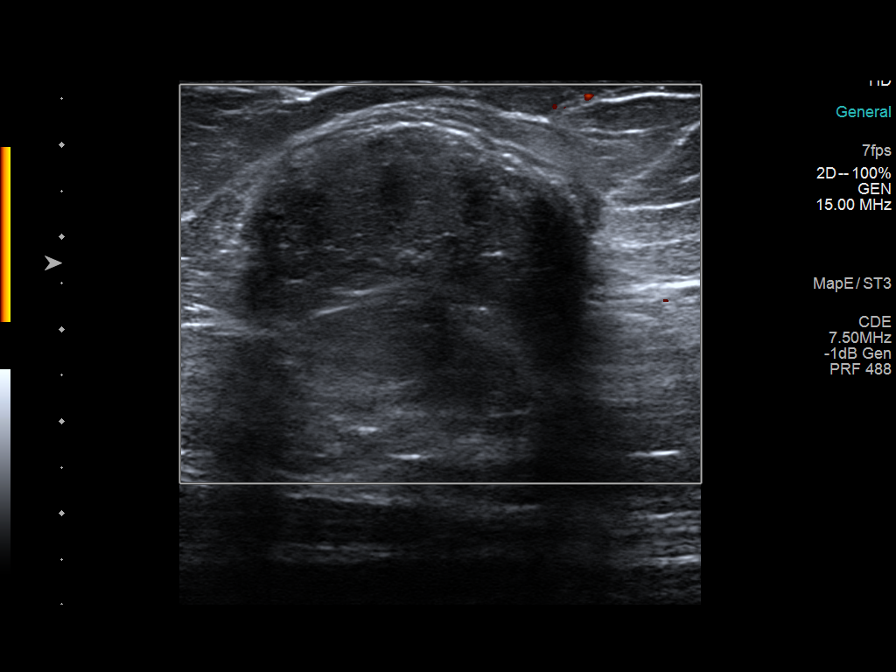
[im 7/7]
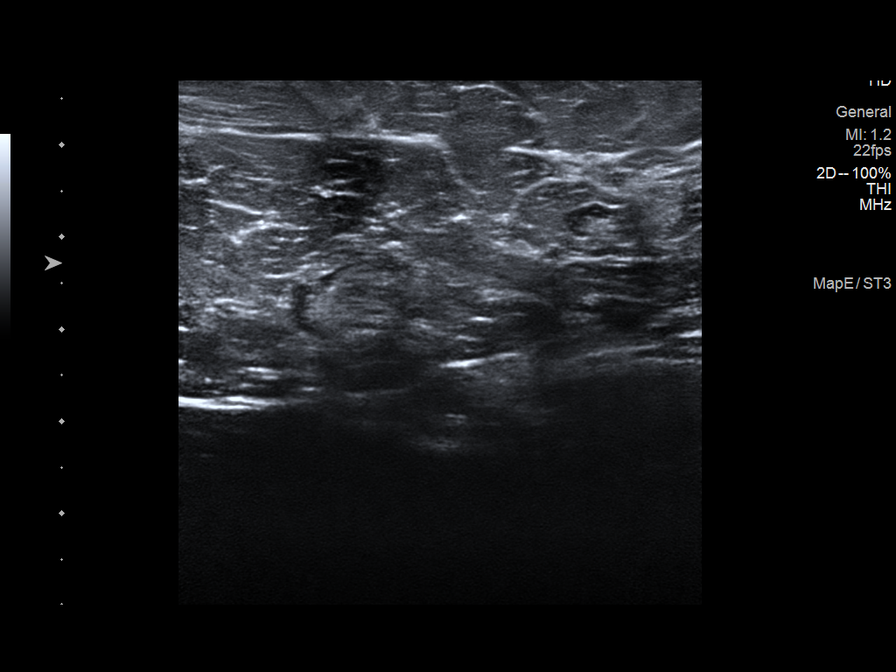

[7 of 7 positions shown; findings below may reference images not displayed]

ACR Breast Density Category b: There are scattered areas of
fibroglandular density.
FINDINGS: Initially, targeted ultrasound to the site of palpable concern
within the right breast was obtained demonstrating a 5.6 x 3.2 x
cm peripherally calcified hypoechoic mass right breast 11 o'clock
position 5 cm from the nipple. Given the large size and appearance
of this mass on ultrasound, it was decided to perform bilateral
mammography, specifically given patient's history of prior reduction
surgery.

Bilateral diagnostic mammography was performed. Palpable abnormality
within the right breast corresponds with a large area of
peripherally calcified fat necrosis.

Within the upper-outer left breast there is a focal area of fat
necrosis. Additionally, there is suggestion of asymmetry and mild
distortion, favored to be postsurgical in etiology.
IMPRESSION: 1. Palpable abnormality within the right breast corresponds with a
large area of fat necrosis.
2. Within the upper-outer left breast there is a focal area of fat
necrosis with associated distortion, favored to be postsurgical in
etiology.

RECOMMENDATION:
Left breast diagnostic mammography in 6 months to assess for
stability of suspected postsurgical changes with fat necrosis
upper-outer left breast.

I have discussed the findings and recommendations with the patient.
Results were also provided in writing at the conclusion of the
visit. If applicable, a reminder letter will be sent to the patient
regarding the next appointment.

BI-RADS CATEGORY  3: Probably benign.

## 2020-01-07 ENCOUNTER — Ambulatory Visit: Payer: BC Managed Care – PPO | Admitting: Psychiatry

## 2020-02-11 ENCOUNTER — Ambulatory Visit: Payer: BC Managed Care – PPO | Admitting: Psychiatry

## 2020-03-20 ENCOUNTER — Other Ambulatory Visit: Payer: Self-pay | Admitting: Psychiatry

## 2020-03-20 DIAGNOSIS — F411 Generalized anxiety disorder: Secondary | ICD-10-CM

## 2020-03-20 DIAGNOSIS — F4001 Agoraphobia with panic disorder: Secondary | ICD-10-CM

## 2020-03-22 NOTE — Telephone Encounter (Signed)
Please review

## 2020-04-15 ENCOUNTER — Telehealth (INDEPENDENT_AMBULATORY_CARE_PROVIDER_SITE_OTHER): Payer: BC Managed Care – PPO | Admitting: Psychiatry

## 2020-04-15 ENCOUNTER — Encounter: Payer: Self-pay | Admitting: Psychiatry

## 2020-04-15 DIAGNOSIS — F3162 Bipolar disorder, current episode mixed, moderate: Secondary | ICD-10-CM

## 2020-04-15 DIAGNOSIS — F411 Generalized anxiety disorder: Secondary | ICD-10-CM

## 2020-04-15 DIAGNOSIS — F4001 Agoraphobia with panic disorder: Secondary | ICD-10-CM | POA: Diagnosis not present

## 2020-04-15 MED ORDER — BREXPIPRAZOLE 2 MG PO TABS: 2.0000 mg | ORAL_TABLET | Freq: Every day | ORAL | 1 refills | Status: DC

## 2020-04-15 MED ORDER — SERTRALINE HCL 100 MG PO TABS: 150.0000 mg | ORAL_TABLET | Freq: Every day | ORAL | 1 refills | Status: DC

## 2020-04-15 NOTE — Progress Notes (Signed)
[name] [id number] 17-Aug-1990 29 y.o.  Virtual Visit via WebEX  I connected with pt by WebEx and verified that I am speaking with the correct person using two identifiers.   I discussed the limitations, risks, security and privacy concerns of performing an evaluation and management service by [facility] [place] and the availability of in person appointments. I also discussed with the patient that there may be a patient responsible charge related to this service. The patient expressed understanding and agreed to proceed.  I discussed the assessment and treatment plan with the patient. The patient was provided an opportunity to ask questions and all were answered. The patient agreed with the plan and demonstrated an understanding of the instructions.   The patient was advised to call back or seek an in-person evaluation if the symptoms worsen or if the condition fails to improve as anticipated.  I provided 30 minutes of video time during this encounter. The call started at 430 and ended at 500. The patient was located at home and the provider was located office.  Subjective:   Patient ID:  [name] is a 29 y.o. (DOB [birth year]) female.  Chief Complaint:  Chief Complaint  Patient presents with  . Follow-up    mood and anxiety    HPI [name] presents to the office today for follow-up of newly diagnosed bipolar disorder mixed, panic disorder, generalized anxiety disorder at her initial office visit August 19, 2018  At that visit fluoxetine was changed to sertraline for better antianxiety effect and increased to 100 mg daily, Abilify was stopped because of akathisia and Rexulti was started off label for mood stabilization at 1 mg to be increased to 2 mg daily.   visit April 2020.  No meds were changed.  But because of recent med changes she was encouraged to come back in 6 weeks.  That did not happen. Consistent with meds since April except missing a week.  visit October 2020.   She asked for trial of increasing sertraline to 150 mg to further help depression.  She continued Rexulti 2 mg.  She was to follow-up in 6 weeks but did not do so.  08/2019 appointment with the following noted: No meds were changed. Really welll with extra sertraline.  Helped a lot.  Depression now under control.  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  Anxiety is manageable.  No manic sx and mood stabilization is better with typically one worse day/week.  No missed work DT depression.  Sleep 8 hour weekdays and less excessive on weekend.  Always needed a lot of sleep.  No anger problems.  Irritable better.  04/15/2020 appointment with the following noted: Patient reports stable mood and denies depressed or irritable moods.  No mania. Patient denies any recent difficulty with anxiety. No panic in a year. Patient denies difficulty with sleep initiation or maintenance. Requires a lot of sleep and has a lot of dreams.  Denies appetite disturbance.  Patient reports that energy and motivation have been good.  Patient denies any difficulty with concentration.  Patient denies any suicidal ideation. Pleased with meds.  Extra 50 mg sertraline made a big difference. No other SE. Wonders about history of diarrhea, not debilitating. Predates sertraline  Past Psychiatric Medication Trials: Fluoxetine 40 with Abilify 5 mg akathisia, Lexapro increased anxiety for 3 weeks. Zoloft 150 with Rexulti 2 mg good Last significant manic episode mid 2018.  History of a  lot of mixed symptoms.  Review of Systems:  Review of Systems  Constitutional: Negative for fatigue.  Gastrointestinal: Positive for diarrhea.  Musculoskeletal: Negative for myalgias.  Neurological: Negative for dizziness, tremors and weakness.  Psychiatric/Behavioral: Negative for agitation, behavioral problems, confusion, decreased concentration, dysphoric mood, hallucinations, self-injury, sleep  disturbance and suicidal ideas. The patient is not nervous/anxious and is not hyperactive.     Medications: I have reviewed the patient's current medications.  Current Outpatient Medications  Medication Sig Dispense Refill  . brexpiprazole (REXULTI) 2 MG TABS tablet Take 1 tablet (2 mg total) by mouth daily. 90 tablet 1  . MULTIPLE VITAMIN PO Take 1 tablet by mouth daily.    . sertraline (ZOLOFT) 100 MG tablet Take 1.5 tablets (150 mg total) by mouth daily. 135 tablet 1   No current facility-administered medications for this visit.    Medication Side Effects: Other: no restlessness like abilify.  Allergies: No Known Allergies  Past Medical History:  Diagnosis Date  . Chicken pox   . Headache(784.0)     Family History  Problem Relation Age of Onset  . Hypertension Father   . Hyperlipidemia Father   . Diabetes Maternal Grandmother        type ll  . Diabetes Maternal Grandfather        type ll  . Stroke Paternal Grandmother   . Hypertension Paternal Grandmother   . Breast cancer Paternal Grandmother        postmenopausal  . Cancer Paternal Grandfather        prostate  . Hyperlipidemia Paternal Grandfather     Social History   Socioeconomic History  . Marital status: Single    Spouse name: Not on file  . Number of children: Not on file  . Years of education: Not on file  . Highest education level: Not on file  Occupational History  . Not on file  Tobacco Use  . Smoking status: Never Smoker  . Smokeless tobacco: Never Used  Vaping Use  . Vaping Use: Never used  Substance and Sexual Activity  . Alcohol use: Yes    Alcohol/week: 10.0 - 11.0 standard drinks    Types: 6 - 7 Cans of beer, 4 Glasses of wine per week    Comment: Trying to cut back  . Drug use: Yes    Types: Marijuana  . Sexual activity: Not on file  Other Topics Concern  . Not on file  Social History Narrative  . Not on file   Social Determinants of Health   Financial Resource Strain:   .  Difficulty of Paying Living Expenses: Not on file  Food Insecurity:   . Worried About Programme researcher, broadcasting/film/video in the Last Year: Not on file  . Ran Out of Food in the Last Year: Not on file  Transportation Needs:   . Lack of Transportation (Medical): Not on file  . Lack of Transportation (Non-Medical): Not on file  Physical Activity:   . Days of Exercise per Week: Not on file  . Minutes of Exercise per Session: Not on file  Stress:   . Feeling of Stress : Not on file  Social Connections:   . Frequency of Communication with Friends and Family: Not on file  . Frequency of Social Gatherings with Friends and Family: Not on file  . Attends Religious Services: Not on file  . Active Member of Clubs or Organizations: Not on file  . Attends Banker Meetings: Not on file  .  Marital Status: Not on file  Intimate Partner Violence:   . Fear of Current or Ex-Partner: Not on file  . Emotionally Abused: Not on file  . Physically Abused: Not on file  . Sexually Abused: Not on file    Past Medical History, Surgical history, Social history, and Family history were reviewed and updated as appropriate.   Please see review of systems for further details on the patient's review from today.   Objective:   Physical Exam:  There were no vitals taken for this visit.  Physical Exam Neurological:     Mental Status: She is alert and oriented to person, place, and time.     Cranial Nerves: No dysarthria.  Psychiatric:        Attention and Perception: Attention normal.        Mood and Affect: Mood is not anxious or depressed. Affect is not tearful.        Speech: Speech normal.        Behavior: Behavior is cooperative.        Thought Content: Thought content normal. Thought content is not paranoid or delusional. Thought content does not include homicidal or suicidal ideation. Thought content does not include homicidal or suicidal plan.        Cognition and Memory: Cognition and memory normal.         Judgment: Judgment normal.     Comments: insight good.     Lab Review:  No results found for: NA, K, CL, CO2, GLUCOSE, BUN, CREATININE, CALCIUM, PROT, ALBUMIN, AST, ALT, ALKPHOS, BILITOT, GFRNONAA, GFRAA     Component Value Date/Time   HGB 14.7 12/29/2010 1330    No results found for: POCLITH, LITHIUM   No results found for: PHENYTOIN, PHENOBARB, VALPROATE, CBMZ   .res Assessment: Plan:    Bipolar 1 disorder, mixed, moderate (HCC) - Plan: brexpiprazole (REXULTI) 2 MG TABS tablet  Panic disorder with agoraphobia - Plan: sertraline (ZOLOFT) 100 MG tablet  Generalized anxiety disorder - Plan: sertraline (ZOLOFT) 100 MG tablet   Recently diagnosed bipolar disorder mixed.  She clearly notices improvement in depression and probably an anxiety with the med changes.  She is not more manic.  She is less irritable.  Since she was last here in April.  She had worsening depression in the summer it has somewhat improved but resolved with the increase in sertraline 150 mg daily.  Anxiety is generally under control as well. Discussed the risk that this can cause mania or rapid cycling.   .   Discussed potential metabolic side effects associated with atypical antipsychotics, as well as potential risk for movement side effects. Advised pt to contact office if movement side effects occur.   On normal dosage of Rexulti 2 mg.  This is being used off label as a mood stabilizer.  So far it is been effective.  Call if there is any development of manic symptoms or worsening mood swings.  Satisfied with meds so no changes.  Follow-up 6 mos  Meredith Staggers, MD, DFAPA  Please see After Visit Summary for patient specific instructions.  No future appointments.  No orders of the defined types were placed in this encounter.     -------------------------------

## 2020-05-04 ENCOUNTER — Telehealth (INDEPENDENT_AMBULATORY_CARE_PROVIDER_SITE_OTHER): Payer: BC Managed Care – PPO | Admitting: Family Medicine

## 2020-05-04 ENCOUNTER — Encounter: Payer: Self-pay | Admitting: Family Medicine

## 2020-05-04 DIAGNOSIS — R059 Cough, unspecified: Secondary | ICD-10-CM

## 2020-05-04 MED ORDER — BENZONATATE 100 MG PO CAPS: 100.0000 mg | ORAL_CAPSULE | Freq: Three times a day (TID) | ORAL | 0 refills | Status: DC | PRN

## 2020-05-04 NOTE — Progress Notes (Signed)
Patient ID: [name], female   DOB: 1991/01/06, 29 y.o.   MRN: [id number]  This visit type was conducted due to national recommendations for restrictions regarding the COVID-19 pandemic in an effort to limit this patient's exposure and mitigate transmission in our community.   Virtual Visit via Telephone Note  I connected with [name] on 05/04/20 at  9:15 AM EST by telephone and verified that I am speaking with the correct person using two identifiers.   I discussed the limitations, risks, security and privacy concerns of performing an evaluation and management service by telephone and the availability of in person appointments. I also discussed with the patient that there may be a patient responsible charge related to this service. The patient expressed understanding and agreed to proceed.  Location patient: home Location provider: work or home office Participants present for the call: patient, provider Patient did not have a visit in the prior 7 days to address this/these issue(s).   History of Present Illness: [name] called with onset this past Saturday of cough and nasal congestion.  No fever.  No dyspnea.  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  She has been vaccinated.  Her cough is most severe symptom.  Keeping awake at night.  No loss of taste or smell.  No nausea or vomiting.  She does not take anything for cough thus far.  Non-smoker.  Past Medical History:  Diagnosis Date  . Chicken pox   . NWGNFAOZ(308.6)    Past Surgical History:  Procedure Laterality Date  . BREAST REDUCTION SURGERY  2012    reports that she has never smoked. She has never used smokeless tobacco. She reports current alcohol use of about 10.0 - 11.0 standard drinks of alcohol per week. She reports current drug use. Drug: Marijuana. family history includes Breast cancer in her paternal grandmother; Cancer in her  paternal grandfather; Diabetes in her maternal grandfather and maternal grandmother; Hyperlipidemia in her father and paternal grandfather; Hypertension in her father and paternal grandmother; Stroke in her paternal grandmother. No Known Allergies    Observations/Objective: Patient sounds cheerful and well on the phone. I do not appreciate any SOB. Speech and thought processing are grossly intact. Patient reported vitals:  Assessment and Plan:  Cough.  Suspect viral illness.  Patient in no respiratory distress  -Consider Covid testing to further clarify -We recommended that she consider quarantine until further clarified -Tessalon Perles 100 mg every 8 hours as needed for cough #30 with no refill -Follow-up for any persistent or worsening symptoms  Follow Up Instructions:  -As above   [id number] 5-10 [id number] 11-20 99443 21-30 I did not refer this patient for an OV in the next 24 hours for this/these issue(s).  I discussed the assessment and treatment plan with the patient. The patient was provided an opportunity to ask questions and all were answered. The patient agreed with the plan and demonstrated an understanding of the instructions.   The patient was advised to call back or seek an in-person evaluation if the symptoms worsen or if the condition fails to improve as anticipated.  I provided 12 minutes of non-face-to-face time during this encounter.   Evelena Peat, MD

## 2020-06-28 IMAGING — MG MM DIGITAL DIAGNOSTIC UNILAT*L* W/ TOMO W/ CAD
4 series · 4 of 12 positions shown · non-contrast
Comparison: 11/01/2018

ACR Breast Density Category a: The breast tissue is almost entirely
fatty.

CLINICAL DATA: Short-term follow-up for a probable area of fat
necrosis/postsurgical scarring in the upper outer left breast.

EXAM:
DIGITAL DIAGNOSTIC UNILATERAL LEFT MAMMOGRAM WITH CAD AND TOMO

[L CC synth-2D]
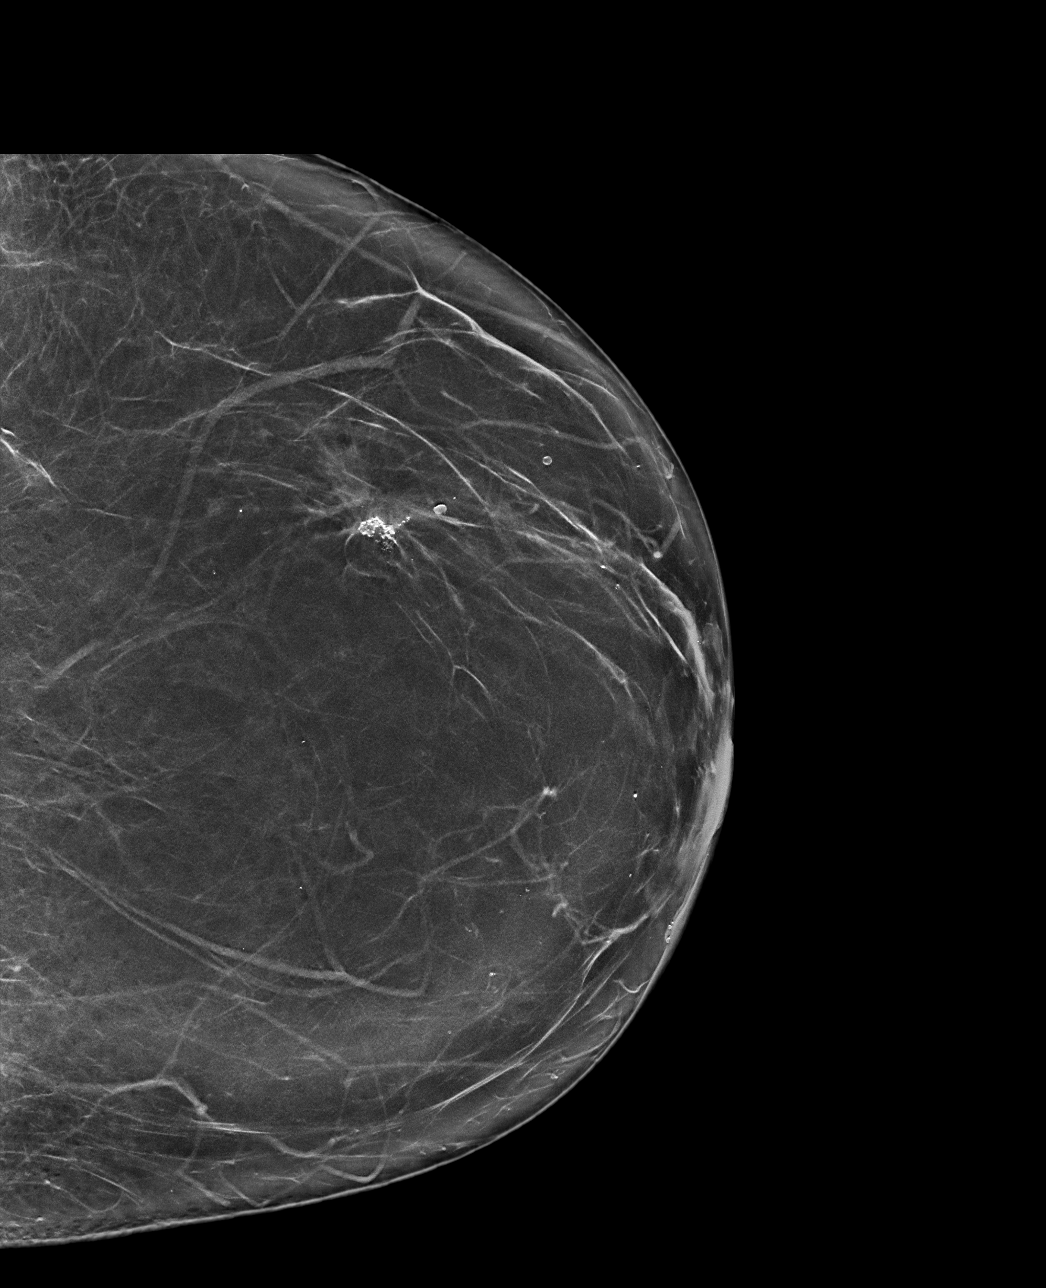

[L MLO synth-2D]
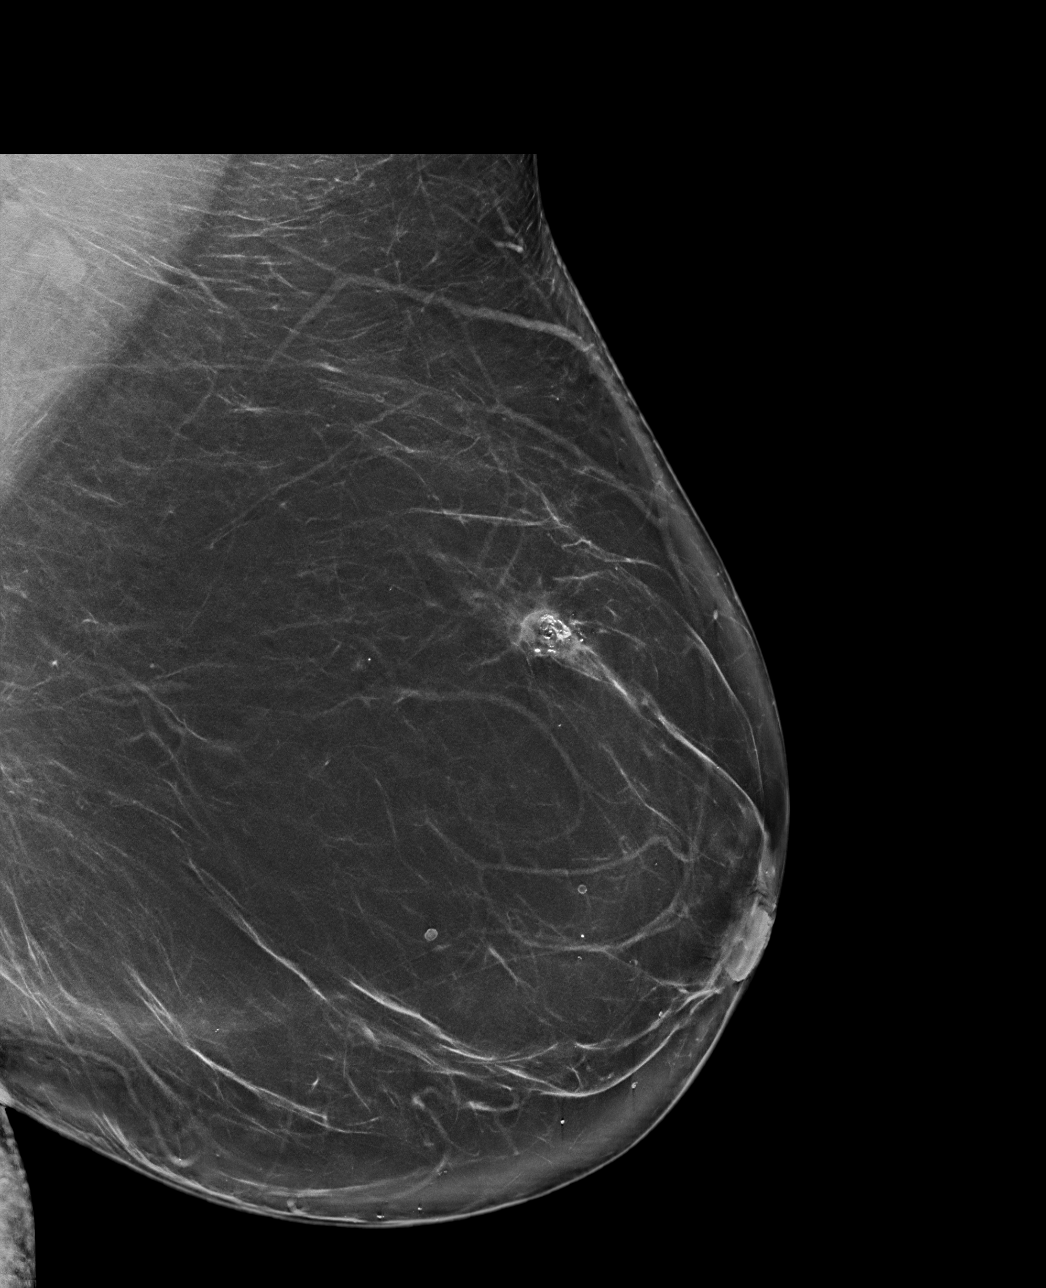

[L MLO tomo · tomo slice 49/96.0]
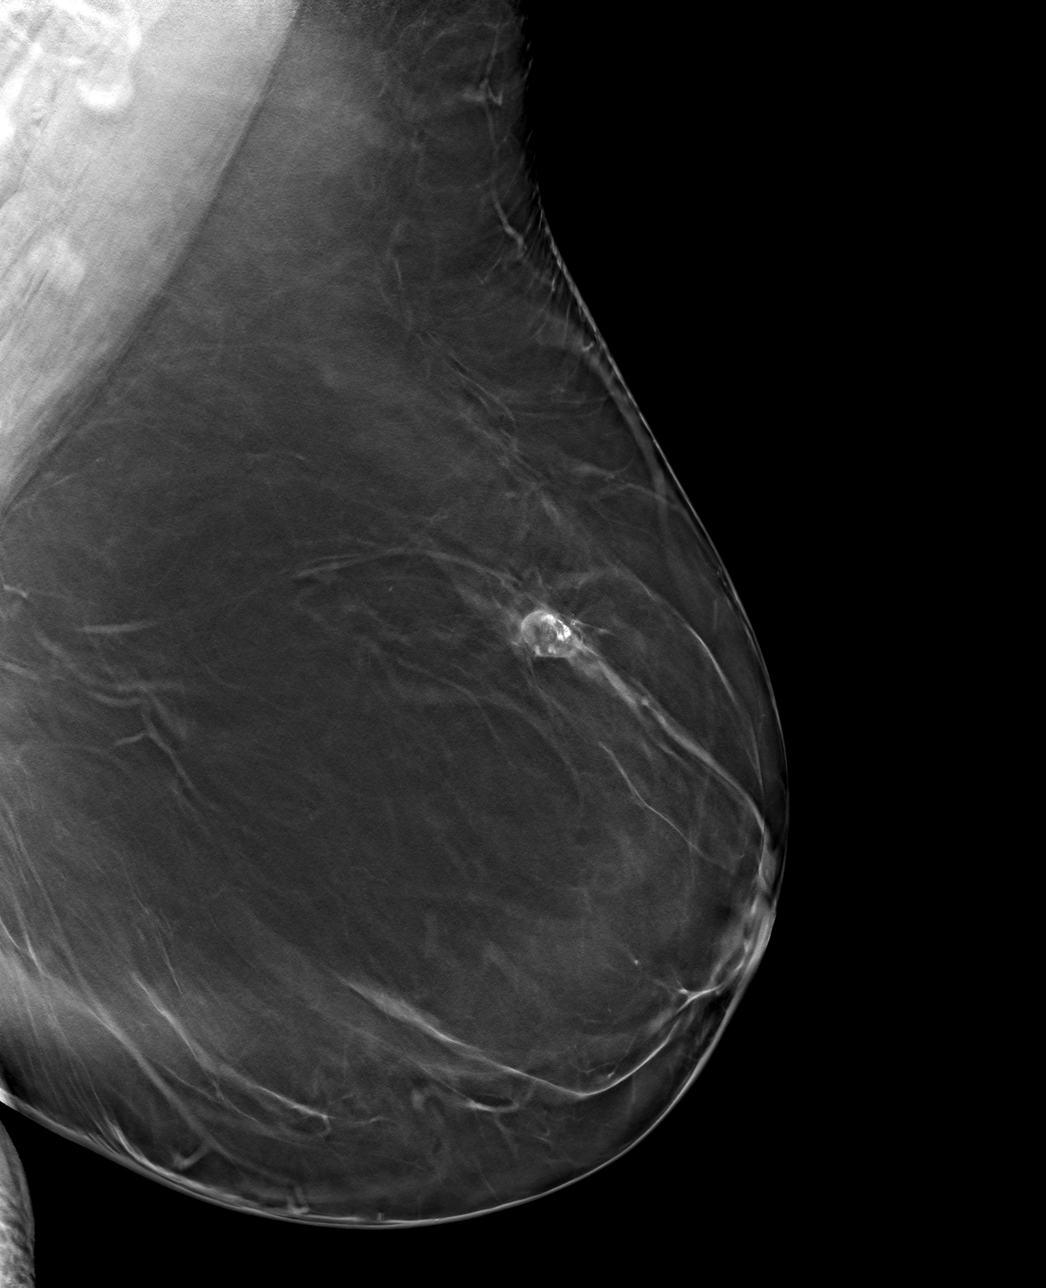

[L CC tomo · tomo slice 43/84.0]
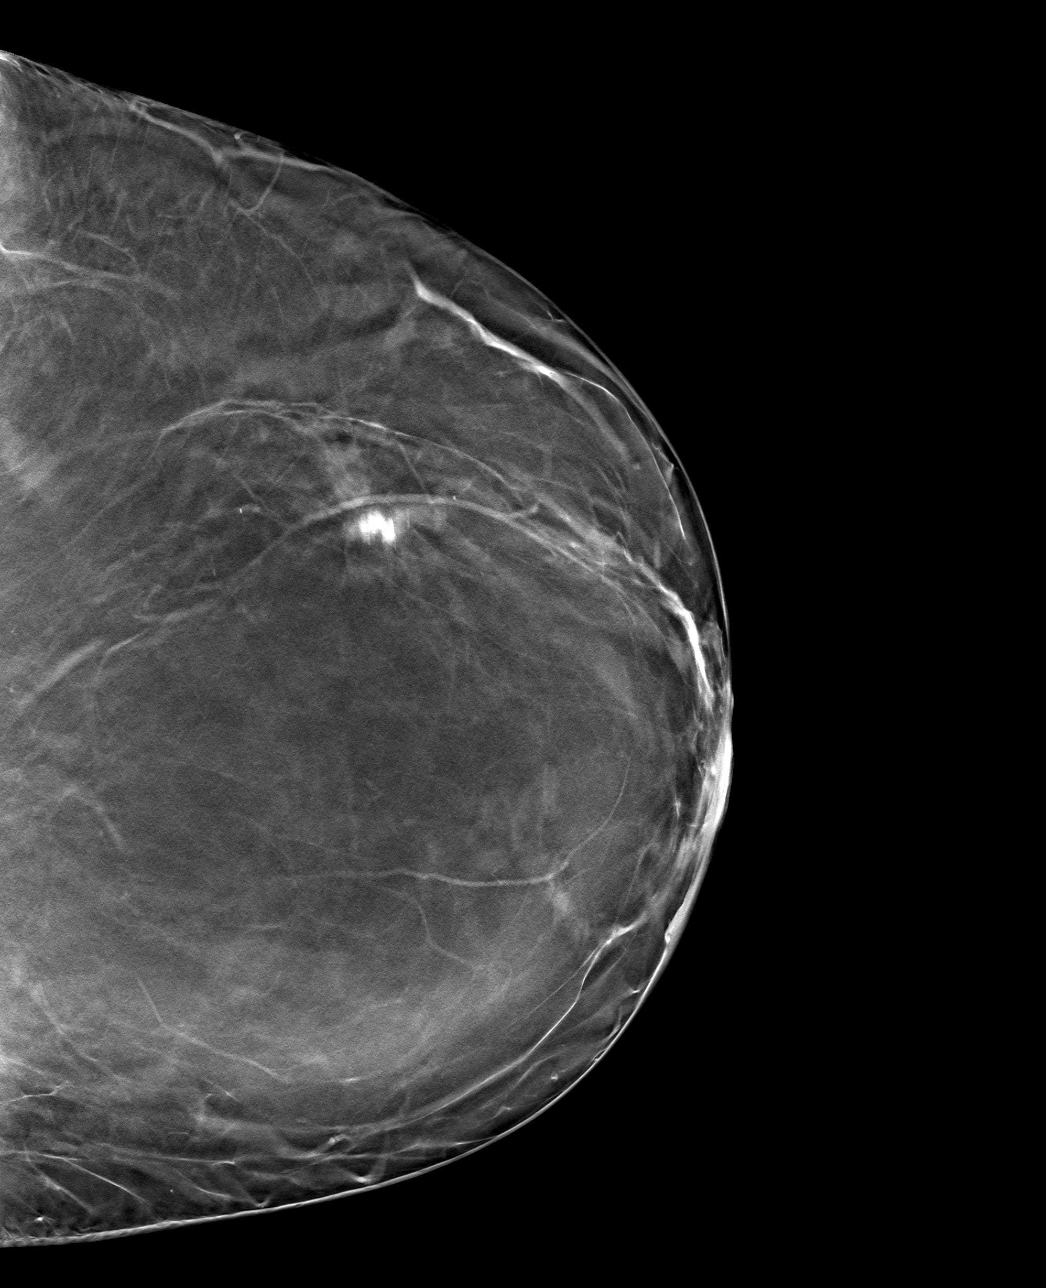

[4 of 12 positions shown; findings below may reference images not displayed]

FINDINGS: Fat necrosis calcifications with associated architectural distortion
consistent with surgical scarring in the upper outer left breast is
stable from the prior exam.

No other left breast abnormalities.

Mammographic images were processed with CAD.
IMPRESSION: 1. No evidence of breast malignancy.
2. Benign left breast fat necrosis with associated postsurgical
scarring.

RECOMMENDATION:
Screening mammogram at age 42 unless there are persistent or
intervening clinical concerns. (Code:[id number]-O-WJL)

I have discussed the findings and recommendations with the patient.
If applicable, a reminder letter will be sent to the patient
regarding the next appointment.

BI-RADS CATEGORY  2: Benign.

## 2020-06-29 ENCOUNTER — Other Ambulatory Visit: Payer: Self-pay | Admitting: Psychiatry

## 2020-06-29 DIAGNOSIS — F411 Generalized anxiety disorder: Secondary | ICD-10-CM

## 2020-06-29 DIAGNOSIS — F4001 Agoraphobia with panic disorder: Secondary | ICD-10-CM

## 2020-09-08 ENCOUNTER — Other Ambulatory Visit: Payer: Self-pay | Admitting: Psychiatry

## 2020-09-08 DIAGNOSIS — F3162 Bipolar disorder, current episode mixed, moderate: Secondary | ICD-10-CM

## 2020-09-08 DIAGNOSIS — F4001 Agoraphobia with panic disorder: Secondary | ICD-10-CM

## 2020-09-08 DIAGNOSIS — F411 Generalized anxiety disorder: Secondary | ICD-10-CM

## 2021-02-24 ENCOUNTER — Other Ambulatory Visit: Payer: Self-pay | Admitting: Psychiatry

## 2021-02-24 DIAGNOSIS — F3162 Bipolar disorder, current episode mixed, moderate: Secondary | ICD-10-CM

## 2021-02-24 NOTE — Telephone Encounter (Signed)
Please schedule appt

## 2021-02-25 NOTE — Telephone Encounter (Signed)
Left a message for pt to schedule 

## 2021-02-28 ENCOUNTER — Encounter: Payer: Self-pay | Admitting: Family Medicine

## 2021-03-01 ENCOUNTER — Other Ambulatory Visit: Payer: Self-pay

## 2021-03-01 ENCOUNTER — Telehealth (INDEPENDENT_AMBULATORY_CARE_PROVIDER_SITE_OTHER): Payer: BC Managed Care – PPO | Admitting: Family Medicine

## 2021-03-01 DIAGNOSIS — J0100 Acute maxillary sinusitis, unspecified: Secondary | ICD-10-CM

## 2021-03-01 MED ORDER — AMOXICILLIN-POT CLAVULANATE 875-125 MG PO TABS: 1.0000 | ORAL_TABLET | Freq: Two times a day (BID) | ORAL | 0 refills | Status: DC

## 2021-03-01 NOTE — Progress Notes (Signed)
Patient ID: [name], female   DOB: Sep 29, 1990, 30 y.o.   MRN: [id number]   This visit type was conducted due to national recommendations for restrictions regarding the COVID-19 pandemic in an effort to limit this patient's exposure and mitigate transmission in our community.   Virtual Visit via Video Note  I connected with [name] on 03/01/21 at 11:45 AM EDT by a video enabled telemedicine application and verified that I am speaking with the correct person using two identifiers.  Location patient: home Location provider:work or home office Persons participating in the virtual visit: patient, provider  I discussed the limitations of evaluation and management by telemedicine and the availability of in person appointments. The patient expressed understanding and agreed to proceed.   HPI:  [name] at onset last weekend of nasal congestion especially right naris along with some sore throat and cough.  Thick yellow mucus.  Intermittent headaches.  Right maxillary pressure.  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  Has not done COVID testing yet.  She was concerned about possible "sinus infection".  Denies any nausea, vomiting, or diarrhea.  No dyspnea.  ROS: See pertinent positives and negatives per HPI.  Past Medical History:  Diagnosis Date   Chicken pox    Headache(784.0)     Past Surgical History:  Procedure Laterality Date   BREAST REDUCTION SURGERY  2012    Family History  Problem Relation Age of Onset   Hypertension Father    Hyperlipidemia Father    Diabetes Maternal Grandmother        type ll   Diabetes Maternal Grandfather        type ll   Stroke Paternal Grandmother    Hypertension Paternal Grandmother    Breast cancer Paternal Grandmother        postmenopausal   Cancer Paternal Grandfather        prostate   Hyperlipidemia Paternal Grandfather     SOCIAL HX: Non-smoker   Current Outpatient Medications:     amoxicillin-clavulanate (AUGMENTIN) 875-125 MG tablet, Take 1 tablet by mouth 2 (two) times daily., Disp: 20 tablet, Rfl: 0   benzonatate (TESSALON PERLES) 100 MG capsule, Take 1 capsule (100 mg total) by mouth 3 (three) times daily as needed for cough., Disp: 30 capsule, Rfl: 0   ibuprofen (ADVIL) 200 MG tablet, Take 200 mg by mouth every 6 (six) hours as needed., Disp: , Rfl:    Pseudoephedrine-APAP-DM (DAYQUIL PO), Take by mouth., Disp: , Rfl:    REXULTI 2 MG TABS tablet, TAKE 1 TABLET(2 MG) BY MOUTH DAILY, Disp: 30 tablet, Rfl: 1   sertraline (ZOLOFT) 100 MG tablet, TAKE 1 AND 1/2 TABLETS(150 MG) BY MOUTH DAILY, Disp: 135 tablet, Rfl: 0  EXAM:  VITALS per patient if applicable:  GENERAL: alert, oriented, appears well and in no acute distress  HEENT: atraumatic, conjunttiva clear, no obvious abnormalities on inspection of external nose and ears  NECK: normal movements of the head and neck  LUNGS: on inspection no signs of respiratory distress, breathing rate appears normal, no obvious gross SOB, gasping or wheezing  CV: no obvious cyanosis  MS: moves all visible extremities without noticeable abnormality  PSYCH/NEURO: pleasant and cooperative, no obvious depression or anxiety, speech and thought processing grossly intact  ASSESSMENT AND PLAN:  Discussed the following assessment and plan:   Acute sinus symptoms.  Patient describing right maxillary pain and pressure with purulent like secretions.  We explained this still could be viral  but she has been prone to sinus infections in the past.  We suggested the following  -Consider home rapid COVID test to make sure -Plenty of fluids and rest -We decided after much discussion to go ahead and start Augmentin 875 mg twice daily for 10 days given her unilateral symptoms and tendency toward acute sinusitis in the past   I discussed the assessment and treatment plan with the patient. The patient was provided an opportunity to ask  questions and all were answered. The patient agreed with the plan and demonstrated an understanding of the instructions.   The patient was advised to call back or seek an in-person evaluation if the symptoms worsen or if the condition fails to improve as anticipated.     Evelena Peat, MD

## 2021-03-06 ENCOUNTER — Encounter: Payer: Self-pay | Admitting: Family Medicine

## 2021-03-29 ENCOUNTER — Other Ambulatory Visit: Payer: Self-pay | Admitting: Psychiatry

## 2021-03-29 ENCOUNTER — Telehealth: Payer: Self-pay | Admitting: Psychiatry

## 2021-03-29 DIAGNOSIS — F3162 Bipolar disorder, current episode mixed, moderate: Secondary | ICD-10-CM

## 2021-03-29 DIAGNOSIS — F4001 Agoraphobia with panic disorder: Secondary | ICD-10-CM

## 2021-03-29 DIAGNOSIS — F411 Generalized anxiety disorder: Secondary | ICD-10-CM

## 2021-03-29 MED ORDER — BREXPIPRAZOLE 2 MG PO TABS: 2.0000 mg | ORAL_TABLET | Freq: Every day | ORAL | 0 refills | Status: DC

## 2021-03-29 MED ORDER — SERTRALINE HCL 100 MG PO TABS: 150.0000 mg | ORAL_TABLET | Freq: Every day | ORAL | 1 refills | Status: DC

## 2021-03-29 NOTE — Telephone Encounter (Signed)
sent 

## 2021-03-29 NOTE — Telephone Encounter (Signed)
Pt has not had a rx for rexulti since 4/25.Is it still ok to send?last seen October 2021 appt on 12/15

## 2021-03-29 NOTE — Telephone Encounter (Signed)
Next visit is 06/02/21. Requesting refill for Rexulti 2 mg and Sertraline 100 mg called to:  [id number] [place], [place] - [address] AT [facility] [address] [facility] [address]  Phone:  [phone]  Fax:  [phone]

## 2021-04-25 ENCOUNTER — Other Ambulatory Visit: Payer: Self-pay | Admitting: Psychiatry

## 2021-04-25 DIAGNOSIS — F3162 Bipolar disorder, current episode mixed, moderate: Secondary | ICD-10-CM

## 2021-05-30 ENCOUNTER — Other Ambulatory Visit: Payer: Self-pay | Admitting: Psychiatry

## 2021-05-30 DIAGNOSIS — F3162 Bipolar disorder, current episode mixed, moderate: Secondary | ICD-10-CM

## 2021-06-02 ENCOUNTER — Other Ambulatory Visit: Payer: Self-pay | Admitting: Psychiatry

## 2021-06-02 ENCOUNTER — Encounter: Payer: Self-pay | Admitting: Psychiatry

## 2021-06-02 ENCOUNTER — Telehealth (INDEPENDENT_AMBULATORY_CARE_PROVIDER_SITE_OTHER): Payer: BC Managed Care – PPO | Admitting: Psychiatry

## 2021-06-02 DIAGNOSIS — F3162 Bipolar disorder, current episode mixed, moderate: Secondary | ICD-10-CM | POA: Diagnosis not present

## 2021-06-02 DIAGNOSIS — F411 Generalized anxiety disorder: Secondary | ICD-10-CM | POA: Diagnosis not present

## 2021-06-02 DIAGNOSIS — F4001 Agoraphobia with panic disorder: Secondary | ICD-10-CM | POA: Diagnosis not present

## 2021-06-02 DIAGNOSIS — F10220 Alcohol dependence with intoxication, uncomplicated: Secondary | ICD-10-CM | POA: Diagnosis not present

## 2021-06-02 MED ORDER — CLONIDINE HCL 0.1 MG PO TABS: ORAL_TABLET | ORAL | 1 refills | Status: DC

## 2021-06-02 MED ORDER — BREXPIPRAZOLE 2 MG PO TABS: ORAL_TABLET | ORAL | 1 refills | Status: DC

## 2021-06-02 NOTE — Progress Notes (Signed)
[name] [id number] June 05, 1991 30 y.o.  Virtual Visit via [place]  I connected with pt by WebEx and verified that I am speaking with the correct person using two identifiers.   I discussed the limitations, risks, security and privacy concerns of performing an evaluation and management service by [facility] and the availability of in person appointments. I also discussed with the patient that there may be a patient responsible charge related to this service. The patient expressed understanding and agreed to proceed.  I discussed the assessment and treatment plan with the patient. The patient was provided an opportunity to ask questions and all were answered. The patient agreed with the plan and demonstrated an understanding of the instructions.   The patient was advised to call back or seek an in-person evaluation if the symptoms worsen or if the condition fails to improve as anticipated.  I provided 30 minutes of video time during this encounter.  The patient was located at home and the provider was located office.  Session from 930 until 10  Subjective:   Patient ID:  [name] is a 30 y.o. (DOB 01-08-1991) female.  Chief Complaint:  Chief Complaint  Patient presents with   Follow-up   Bipolar 1 disorder, mixed, moderate     HPI [name] presents to the office today for follow-up of newly diagnosed bipolar disorder mixed, panic disorder, generalized anxiety disorder at her initial office visit August 19, 2018  At that visit fluoxetine was changed to sertraline for better antianxiety effect and increased to 100 mg daily, Abilify was stopped because of akathisia and Rexulti was started off label for mood stabilization at 1 mg to be increased to 2 mg daily.   visit April 2020.  No meds were changed.  But because of recent med changes she was encouraged to come back in 6 weeks.  That did not happen. Consistent with meds since April except missing a week.  visit October  2020.  She asked for trial of increasing sertraline to 150 mg to further help depression.  She continued Rexulti 2 mg.  She was to follow-up in 6 weeks but did not do so.  08/2019 appointment with the following noted: No meds were changed. Really welll with extra sertraline.  Helped a lot.  Depression now under control.  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  Anxiety is manageable.  No manic sx and mood stabilization is better with typically one worse day/week.  No missed work DT depression.  Sleep 8 hour weekdays and less excessive on weekend.  Always needed a lot of sleep.  No anger problems.  Irritable better.  04/15/2020 appointment with the following noted: Patient reports stable mood and denies depressed or irritable moods.  No mania. Patient denies any recent difficulty with anxiety. No panic in a year. Patient denies difficulty with sleep initiation or maintenance. Requires a lot of sleep and has a lot of dreams.  Denies appetite disturbance.  Patient reports that energy and motivation have been good.  Patient denies any difficulty with concentration.  Patient denies any suicidal ideation. Pleased with meds.  Extra 50 mg sertraline made a big difference. No other SE. Wonders about history of diarrhea, not debilitating. Predates sertraline  06/02/21 appt noted: Doing fine except recently a little more anxiety and depression and anxiety dreams. Stopped sertraline and Rexulti for the last week.  Worried she might be drinking too much  and  that it was interacting with school.  Trying to cut back on drinking.  Drinking problem for some years.   Stopped sertraline 150 mg and Rexulti 2 abruptly but denies withdrawal.   Feeling more depressed and anxious since October. Teacher and it's difficult. Seasonal depression too. Anxiety is worse in AM regardless of meds or alcohol.  Feels uneasy and not wanting to get day started.   Past  Psychiatric Medication Trials: Fluoxetine 40 with Abilify 5 mg akathisia, Lexapro increased anxiety for 3 weeks. Zoloft 150 with Rexulti 2 mg good Last significant manic episode mid 2018.  History of a lot of mixed symptoms.  Review of Systems:  Review of Systems  Constitutional:  Negative for fatigue.  Gastrointestinal:  Positive for diarrhea.  Musculoskeletal:  Negative for myalgias.  Neurological:  Positive for headaches. Negative for dizziness, tremors and weakness.  Psychiatric/Behavioral:  Positive for sleep disturbance. Negative for agitation, behavioral problems, confusion, decreased concentration, dysphoric mood, hallucinations, self-injury and suicidal ideas. The patient is nervous/anxious. The patient is not hyperactive.    Medications: I have reviewed the patient's current medications.  Current Outpatient Medications  Medication Sig Dispense Refill   cloNIDine (CATAPRES) 0.1 MG tablet 1/2 tablet at night for 2 nights then 1 at night for 7 nights then 1/2 tablet in AM and 1 tablet at night 45 tablet 1   ibuprofen (ADVIL) 200 MG tablet Take 200 mg by mouth every 6 (six) hours as needed.     Multiple Vitamin (MULTIVITAMIN) tablet Take 1 tablet by mouth daily.     vitamin E 45 MG (100 UNITS) capsule Take by mouth daily.     brexpiprazole (REXULTI) 2 MG TABS tablet TAKE 1 TABLET(2 MG) BY MOUTH DAILY 30 tablet 1   No current facility-administered medications for this visit.    Medication Side Effects: Other: no restlessness like abilify.  Allergies: No Known Allergies  Past Medical History:  Diagnosis Date   Chicken pox    Headache(784.0)     Family History  Problem Relation Age of Onset   Hypertension Father    Hyperlipidemia Father    Diabetes Maternal Grandmother        type ll   Diabetes Maternal Grandfather        type ll   Stroke Paternal Grandmother    Hypertension Paternal Grandmother    Breast cancer Paternal Grandmother        postmenopausal   Cancer  Paternal Grandfather        prostate   Hyperlipidemia Paternal Grandfather     Social History   Socioeconomic History   Marital status: Single    Spouse name: Not on file   Number of children: Not on file   Years of education: Not on file   Highest education level: Not on file  Occupational History   Not on file  Tobacco Use   Smoking status: Never   Smokeless tobacco: Never  Vaping Use   Vaping Use: Never used  Substance and Sexual Activity   Alcohol use: Yes    Alcohol/week: 10.0 - 11.0 standard drinks    Types: 6 - 7 Cans of beer, 4 Glasses of wine per week    Comment: Trying to cut back   Drug use: Yes    Types: Marijuana   Sexual activity: Not on file  Other Topics Concern   Not on file  Social History Narrative   Not on file   Social Determinants of Health   Financial Resource Strain:  Not on file  Food Insecurity: Not on file  Transportation Needs: Not on file  Physical Activity: Not on file  Stress: Not on file  Social Connections: Not on file  Intimate Partner Violence: Not on file    Past Medical History, Surgical history, Social history, and Family history were reviewed and updated as appropriate.   Please see review of systems for further details on the patient's review from today.   Objective:   Physical Exam:  There were no vitals taken for this visit.  Physical Exam Neurological:     Mental Status: She is alert and oriented to person, place, and time.     Cranial Nerves: No dysarthria.  Psychiatric:        Attention and Perception: Attention normal.        Mood and Affect: Mood is anxious and depressed. Affect is not tearful.        Speech: Speech normal.        Behavior: Behavior is cooperative.        Thought Content: Thought content normal. Thought content is not paranoid or delusional. Thought content does not include homicidal or suicidal ideation. Thought content does not include homicidal or suicidal plan.        Cognition and  Memory: Cognition and memory normal.        Judgment: Judgment normal.     Comments: insight good.    Lab Review:  No results found for: NA, K, CL, CO2, GLUCOSE, BUN, CREATININE, CALCIUM, PROT, ALBUMIN, AST, ALT, ALKPHOS, BILITOT, GFRNONAA, GFRAA     Component Value Date/Time   HGB 14.7 12/29/2010 1330    No results found for: POCLITH, LITHIUM   No results found for: PHENYTOIN, PHENOBARB, VALPROATE, CBMZ   .res Assessment: Plan:    Bipolar 1 disorder, mixed, moderate (HCC) - Plan: brexpiprazole (REXULTI) 2 MG TABS tablet  Generalized anxiety disorder - Plan: cloNIDine (CATAPRES) 0.1 MG tablet  Panic disorder with agoraphobia - Plan: cloNIDine (CATAPRES) 0.1 MG tablet  Alcohol dependence with uncomplicated intoxication (HCC)   Greater than 50% of 30 min video face to face time with patient was spent on counseling and coordination of care. We discussed Recently diagnosed bipolar disorder mixed.  She also has acknowledged problem with alcohol.  We discussed treatment options.  She is not currently missing work. She has had more problems with depression and anxiety lately.  We discussed how alcohol can interfere with the effectiveness of the medication.  She stopped sertraline and Rexulti abruptly about a week or so ago.  She has had no SSRI withdrawal.  She does not want to continue sertraline.  We discussed alternatives for alcohol and anxiety.  She may get some benefit from clonidine which has some antianxiety effects off label.  We will pursue that. .   Discussed potential metabolic side effects associated with atypical antipsychotics, as well as potential risk for movement side effects. Advised pt to contact office if movement side effects occur.   On normal dosage of Rexulti 2 mg.  This is being used off label as a mood stabilizer.  So far it is been effective.  Call if there is any development of manic symptoms or worsening mood swings.  Restart Rexulti 2 mg  daily Clonidine 0.1 mg 1/2 tablet at night for 2 nights then 1 at night for 7 nights then 1/2 tablet in AM and 1 tablet at night DC sertraline because of lack of sufficient benefit as well as disturbing dreams and  suppressed tearfulness  Follow-up 4 weeks  Meredith Staggers, MD, DFAPA  Please see After Visit Summary for patient specific instructions.  Future Appointments  Date Time Provider Department Center  07/05/2021  9:15 AM Burchette, Elberta Fortis, MD [id number]    No orders of the defined types were placed in this encounter.     -------------------------------

## 2021-07-05 ENCOUNTER — Ambulatory Visit (INDEPENDENT_AMBULATORY_CARE_PROVIDER_SITE_OTHER): Payer: BC Managed Care – PPO | Admitting: Family Medicine

## 2021-07-05 ENCOUNTER — Other Ambulatory Visit: Payer: Self-pay | Admitting: Psychiatry

## 2021-07-05 VITALS — BP 130/70 | HR 84 | Temp 97.8°F | Ht 64.0 in | Wt 292.6 lb

## 2021-07-05 DIAGNOSIS — F3162 Bipolar disorder, current episode mixed, moderate: Secondary | ICD-10-CM

## 2021-07-05 DIAGNOSIS — Z0000 Encounter for general adult medical examination without abnormal findings: Secondary | ICD-10-CM | POA: Diagnosis not present

## 2021-07-05 LAB — BASIC METABOLIC PANEL
BUN: 9 mg/dL (ref 6–23)
CO2: 28 mEq/L (ref 19–32)
Calcium: 9.3 mg/dL (ref 8.4–10.5)
Chloride: 101 mEq/L (ref 96–112)
Creatinine, Ser: 0.73 mg/dL (ref 0.40–1.20)
GFR: 111.6 mL/min (ref >60.00)
Glucose, Bld: 104 mg/dL — ABNORMAL HIGH (ref 70–99)
Potassium: 4.6 mEq/L (ref 3.5–5.1)
Sodium: 136 mEq/L (ref 135–145)

## 2021-07-05 LAB — HEPATIC FUNCTION PANEL
ALT: 28 U/L (ref 0–35)
AST: 23 U/L (ref 0–37)
Albumin: 4.4 g/dL (ref 3.5–5.2)
Alkaline Phosphatase: 55 U/L (ref 39–117)
Bilirubin, Direct: 0 mg/dL (ref 0.0–0.3)
Total Bilirubin: 0.3 mg/dL (ref 0.2–1.2)
Total Protein: 7.8 g/dL (ref 6.0–8.3)

## 2021-07-05 LAB — CBC WITH DIFFERENTIAL/PLATELET
Basophils Absolute: 0 10*3/uL (ref 0.0–0.1)
Basophils Relative: 0.4 % (ref 0.0–3.0)
Eosinophils Absolute: 0.3 10*3/uL (ref 0.0–0.7)
Eosinophils Relative: 3.5 % (ref 0.0–5.0)
HCT: 40.2 % (ref 36.0–46.0)
Hemoglobin: 12.9 g/dL (ref 12.0–15.0)
Lymphocytes Relative: 16.6 % (ref 12.0–46.0)
Lymphs Abs: 1.3 10*3/uL (ref 0.7–4.0)
MCHC: 32.2 g/dL (ref 30.0–36.0)
MCV: 86.6 fl (ref 78.0–100.0)
Monocytes Absolute: 0.5 10*3/uL (ref 0.1–1.0)
Monocytes Relative: 6.8 % (ref 3.0–12.0)
Neutro Abs: 5.8 10*3/uL (ref 1.4–7.7)
Neutrophils Relative %: 72.7 % (ref 43.0–77.0)
Platelets: 353 10*3/uL (ref 150.0–400.0)
RBC: 4.64 Mil/uL (ref 3.87–5.11)
RDW: 16.8 % — ABNORMAL HIGH (ref 11.5–15.5)
WBC: 8 10*3/uL (ref 4.0–10.5)

## 2021-07-05 LAB — LDL CHOLESTEROL, DIRECT: Direct LDL: 153 mg/dL

## 2021-07-05 LAB — LIPID PANEL
Cholesterol: 219 mg/dL — ABNORMAL HIGH (ref 0–200)
HDL: 49.3 mg/dL (ref >39.00)
NonHDL: 169.87
Total CHOL/HDL Ratio: 4
Triglycerides: 211 mg/dL — ABNORMAL HIGH (ref 0.0–149.0)
VLDL: 42.2 mg/dL — ABNORMAL HIGH (ref 0.0–40.0)

## 2021-07-05 LAB — TSH: TSH: 6.95 u[IU]/mL — ABNORMAL HIGH (ref 0.35–5.50)

## 2021-07-05 NOTE — Progress Notes (Signed)
Established Patient Office Visit  Subjective:  Patient ID: [name], female    DOB: 04/13/1991  Age: 31 y.o. MRN: [id number]  CC:  Chief Complaint  Patient presents with   Annual Exam    HPI [name] presents for physical exam.  She has history of some chronic anxiety and depression and is followed by psychiatry.  She feels like she is currently on a good regimen with Rexulti and also takes low-dose clonidine at night.  She has had substantial weight gain during the past year.  She attributes a lot of this to stress eating.  She plans to establish with GYN soon.  Last Pap smear few years ago.  Low risk.  Health maintenance reviewed: Declines tetanus.  She thinks this may be less than 10 years ago.  Declines flu vaccine.  No history of hepatitis C screening.  She is low risk.  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  Generally satisfied with teaching but does have a lot of stresses with teaching as expected.  No smoking history.  Does drink wine almost daily.  She knows she may need to scale back.  Family history-significant for father having hypertension and hyperlipidemia as well as history of DVT  Wt Readings from Last 3 Encounters:  07/05/21 292 lb 9.6 oz (132.7 kg)  08/16/18 213 lb 14.4 oz (97 kg)  07/29/18 203 lb 14.4 oz (92.5 kg)      Past Medical History:  Diagnosis Date   Chicken pox    Headache(784.0)     Past Surgical History:  Procedure Laterality Date   BREAST REDUCTION SURGERY  2012    Family History  Problem Relation Age of Onset   Hypertension Father    Hyperlipidemia Father    Diabetes Maternal Grandmother        type ll   Diabetes Maternal Grandfather        type ll   Stroke Paternal Grandmother    Hypertension Paternal Grandmother    Breast cancer Paternal Grandmother        postmenopausal   Cancer Paternal Grandfather        prostate   Hyperlipidemia Paternal Grandfather      Social History   Socioeconomic History   Marital status: Single    Spouse name: Not on file   Number of children: Not on file   Years of education: Not on file   Highest education level: Not on file  Occupational History   Not on file  Tobacco Use   Smoking status: Never   Smokeless tobacco: Never  Vaping Use   Vaping Use: Never used  Substance and Sexual Activity   Alcohol use: Yes    Alcohol/week: 10.0 - 11.0 standard drinks    Types: 6 - 7 Cans of beer, 4 Glasses of wine per week    Comment: Trying to cut back   Drug use: Yes    Types: Marijuana   Sexual activity: Not on file  Other Topics Concern   Not on file  Social History Narrative   Not on file   Social Determinants of Health   Financial Resource Strain: Not on file  Food Insecurity: Not on file  Transportation Needs: Not on file  Physical Activity: Not on file  Stress: Not on file  Social Connections: Not on file  Intimate Partner Violence: Not on file    Outpatient Medications Prior to Visit  Medication Sig Dispense  Refill   brexpiprazole (REXULTI) 2 MG TABS tablet TAKE 1 TABLET(2 MG) BY MOUTH DAILY 30 tablet 1   cloNIDine (CATAPRES) 0.1 MG tablet 1/2 tablet at night for 2 nights then 1 at night for 7 nights then 1/2 tablet in AM and 1 tablet at night 45 tablet 1   ibuprofen (ADVIL) 200 MG tablet Take 200 mg by mouth every 6 (six) hours as needed.     Multiple Vitamin (MULTIVITAMIN) tablet Take 1 tablet by mouth daily.     vitamin E 45 MG (100 UNITS) capsule Take by mouth daily.     No facility-administered medications prior to visit.    No Known Allergies  ROS Review of Systems  Constitutional:  Negative for activity change, appetite change, fatigue and fever.  HENT:  Negative for ear pain, hearing loss, sore throat and trouble swallowing.   Eyes:  Negative for visual disturbance.  Respiratory:  Negative for cough and shortness of breath.   Cardiovascular:  Negative for chest pain and  palpitations.  Gastrointestinal:  Negative for abdominal pain, blood in stool, constipation and diarrhea.  Endocrine: Negative for polydipsia and polyuria.  Genitourinary:  Negative for dysuria and hematuria.  Musculoskeletal:  Negative for arthralgias, back pain and myalgias.  Skin:  Negative for rash.  Neurological:  Negative for dizziness, syncope and headaches.  Hematological:  Negative for adenopathy.  Psychiatric/Behavioral:  Negative for confusion and dysphoric mood.      Objective:    Physical Exam Vitals reviewed.  Constitutional:      Appearance: She is well-developed.  HENT:     Head: Normocephalic and atraumatic.  Eyes:     Pupils: Pupils are equal, round, and reactive to light.  Neck:     Thyroid: No thyromegaly.  Cardiovascular:     Rate and Rhythm: Normal rate and regular rhythm.     Heart sounds: Normal heart sounds. No murmur heard. Pulmonary:     Effort: No respiratory distress.     Breath sounds: Normal breath sounds. No wheezing or rales.  Abdominal:     General: Bowel sounds are normal. There is no distension.     Palpations: Abdomen is soft. There is no mass.     Tenderness: There is no abdominal tenderness. There is no guarding or rebound.  Musculoskeletal:        General: Normal range of motion.     Cervical back: Normal range of motion and neck supple.     Right lower leg: No edema.     Left lower leg: No edema.  Lymphadenopathy:     Cervical: No cervical adenopathy.  Skin:    Findings: No rash.  Neurological:     Mental Status: She is alert and oriented to person, place, and time.     Cranial Nerves: No cranial nerve deficit.  Psychiatric:        Behavior: Behavior normal.        Thought Content: Thought content normal.        Judgment: Judgment normal.    BP 130/70 (BP Location: Left Arm, Patient Position: Sitting, Cuff Size: Normal)    Pulse 84    Temp 97.8 F (36.6 C) (Oral)    Ht 5' 4"  (1.626 m)    Wt 292 lb 9.6 oz (132.7 kg)    SpO2  99%    BMI 50.22 kg/m  Wt Readings from Last 3 Encounters:  07/05/21 292 lb 9.6 oz (132.7 kg)  08/16/18 213 lb 14.4 oz (97 kg)  07/29/18 203 lb 14.4 oz (92.5 kg)     Health Maintenance Due  Topic Date Due   COVID-19 Vaccine (1) Never done   HIV Screening  Never done   Hepatitis C Screening  Never done    There are no preventive care reminders to display for this patient.  No results found for: TSH Lab Results  Component Value Date   HGB 14.7 12/29/2010   No results found for: NA, K, CHLORIDE, CO2, GLUCOSE, BUN, CREATININE, BILITOT, ALKPHOS, AST, ALT, PROT, ALBUMIN, CALCIUM, ANIONGAP, EGFR, GFR No results found for: CHOL No results found for: HDL No results found for: LDLCALC No results found for: TRIG No results found for: CHOLHDL No results found for: HGBA1C    Assessment & Plan:   Problem List Items Addressed This Visit   None Visit Diagnoses     Physical exam    -  Primary   Relevant Orders   Basic metabolic panel   Lipid panel   CBC with Differential/Platelet   TSH   Hepatic function panel   Hep C Antibody     Patient has had tremendous weight gain over the past couple years.  She knows she needs to make some dietary changes and also start more consistent exercise.  She plans to do some things on her own.  She has very good knowledge of what to do. -We also discussed scaling back alcohol consumption which will help with overall calorie reduction.  -Check labs including hepatitis C antibody.  -She plans to establish with GYN for Pap smears.  -Offered flu vaccine but she declines.  We have asked that she try to confirm date of last tetanus.  No orders of the defined types were placed in this encounter.   Follow-up: No follow-ups on file.    Carolann Littler, MD

## 2021-07-06 LAB — HEPATITIS C ANTIBODY
Hepatitis C Ab: NONREACTIVE
SIGNAL TO CUT-OFF: 0.11 (ref <1.00)

## 2021-07-08 ENCOUNTER — Other Ambulatory Visit: Payer: Self-pay

## 2021-07-08 DIAGNOSIS — E039 Hypothyroidism, unspecified: Secondary | ICD-10-CM

## 2021-07-19 ENCOUNTER — Telehealth: Payer: BC Managed Care – PPO | Admitting: Psychiatry

## 2021-07-19 ENCOUNTER — Encounter: Payer: Self-pay | Admitting: Psychiatry

## 2021-07-19 DIAGNOSIS — F3162 Bipolar disorder, current episode mixed, moderate: Secondary | ICD-10-CM

## 2021-07-19 DIAGNOSIS — F4001 Agoraphobia with panic disorder: Secondary | ICD-10-CM

## 2021-07-19 DIAGNOSIS — F411 Generalized anxiety disorder: Secondary | ICD-10-CM

## 2021-07-19 MED ORDER — BREXPIPRAZOLE 2 MG PO TABS: 2.0000 mg | ORAL_TABLET | Freq: Every day | ORAL | 2 refills | Status: DC

## 2021-07-19 MED ORDER — CLONIDINE HCL 0.1 MG PO TABS: 0.1000 mg | ORAL_TABLET | Freq: Two times a day (BID) | ORAL | 1 refills | Status: DC

## 2021-07-19 NOTE — Progress Notes (Signed)
[name] [id number] 01/19/1991 31 y.o.  Video Visit via My Chart  I connected with pt by My Chart and verified that I am speaking with the correct person using two identifiers.   I discussed the limitations, risks, security and privacy concerns of performing an evaluation and management service by My Chart  and the availability of in person appointments. I also discussed with the patient that there may be a patient responsible charge related to this service. The patient expressed understanding and agreed to proceed.  I discussed the assessment and treatment plan with the patient. The patient was provided an opportunity to ask questions and all were answered. The patient agreed with the plan and demonstrated an understanding of the instructions.   The patient was advised to call back or seek an in-person evaluation if the symptoms worsen or if the condition fails to improve as anticipated.  I provided 30 minutes of video time during this encounter.  The patient was located at home and the provider was located office. Session from 11 AM to 11:30 AM  Subjective:   Patient ID:  [name] is a 31 y.o. (DOB [birth year]) female.  Chief Complaint:  Chief Complaint  Patient presents with   Follow-up   Manic Behavior   Depression   Anxiety    HPI [name] presents to the office today for follow-up of newly diagnosed bipolar disorder mixed, panic disorder, generalized anxiety disorder at her initial office visit August 19, 2018  At that visit fluoxetine was changed to sertraline for better antianxiety effect and increased to 100 mg daily, Abilify was stopped because of akathisia and Rexulti was started off label for mood stabilization at 1 mg to be increased to 2 mg daily.   visit April 2020.  No meds were changed.  But because of recent med changes she was encouraged to come back in 6 weeks.  That did not happen. Consistent with meds since April except missing a  week.  visit October 2020.  She asked for trial of increasing sertraline to 150 mg to further help depression.  She continued Rexulti 2 mg.  She was to follow-up in 6 weeks but did not do so.  08/2019 appointment with the following noted: No meds were changed. Really welll with extra sertraline.  Helped a lot.  Depression now under control.  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  Anxiety is manageable.  No manic sx and mood stabilization is better with typically one worse day/week.  No missed work DT depression.  Sleep 8 hour weekdays and less excessive on weekend.  Always needed a lot of sleep.  No anger problems.  Irritable better.  04/15/2020 appointment with the following noted: Patient reports stable mood and denies depressed or irritable moods.  No mania. Patient denies any recent difficulty with anxiety. No panic in a year. Patient denies difficulty with sleep initiation or maintenance. Requires a lot of sleep and has a lot of dreams.  Denies appetite disturbance.  Patient reports that energy and motivation have been good.  Patient denies any difficulty with concentration.  Patient denies any suicidal ideation. Pleased with meds.  Extra 50 mg sertraline made a big difference. No other SE. Wonders about history of diarrhea, not debilitating. Predates sertraline  06/02/21 appt noted: Doing fine except recently a little more anxiety and depression and anxiety dreams. Stopped sertraline and Rexulti for the last week.  Worried she  might be drinking too much  and that it was interacting with school.  Trying to cut back on drinking.  Drinking problem for some years.   Stopped sertraline 150 mg and Rexulti 2 abruptly but denies withdrawal.   Feeling more depressed and anxious since October. Teacher and it's difficult. Seasonal depression too. Anxiety is worse in AM regardless of meds or alcohol.  Feels uneasy and not wanting to get day  started. Plan: Restart Rexulti 2 mg daily Clonidine 0.1 mg 1/2 tablet at night for 2 nights then 1 at night for 7 nights then 1/2 tablet in AM and 1 tablet at night DC sertraline because of lack of sufficient benefit as well as disturbing dreams and suppressed tearfulness  07/19/2021 appointment with the following noted: Forgets am clonidine often 0.05 but remembers 0.1 mg HS with less am anxiety and dread and sleeping really well.  Dreams are better with clonidine.  Stopped drinking weeknights and sleep is better.  But was irritable in the am last week but it gets better. Mood overall is OK.  School is huge stressor but handling it well.   No SE that are a problem. Less anxious.  Depression is not high.  Better able to cry off the sertraline and did cry more over th holidays right after stopping the sertraline. HA manageable and less in AM since stopped alcohol. No concerns with Rexulti.  Past Psychiatric Medication Trials: Fluoxetine 40 with Abilify 5 mg akathisia, Lexapro increased anxiety for 3 weeks. Zoloft 150 with Rexulti 2 mg good Last significant manic episode mid 2018.  History of a lot of mixed symptoms.  Review of Systems:  Review of Systems  Constitutional:  Negative for fatigue.  Gastrointestinal:  Negative for diarrhea.  Musculoskeletal:  Negative for myalgias.  Neurological:  Negative for dizziness, tremors, weakness and headaches.  Psychiatric/Behavioral:  Negative for agitation, behavioral problems, confusion, decreased concentration, dysphoric mood, hallucinations, self-injury, sleep disturbance and suicidal ideas. The patient is nervous/anxious. The patient is not hyperactive.    Medications: I have reviewed the patient's current medications.  Current Outpatient Medications  Medication Sig Dispense Refill   ibuprofen (ADVIL) 200 MG tablet Take 200 mg by mouth every 6 (six) hours as needed.     Multiple Vitamin (MULTIVITAMIN) tablet Take 1 tablet by mouth daily.      brexpiprazole (REXULTI) 2 MG TABS tablet Take 1 tablet (2 mg total) by mouth daily. 30 tablet 2   cloNIDine (CATAPRES) 0.1 MG tablet Take 1 tablet (0.1 mg total) by mouth 2 (two) times daily. 180 tablet 1   No current facility-administered medications for this visit.    Medication Side Effects: Other: no restlessness like abilify.  Allergies: No Known Allergies  Past Medical History:  Diagnosis Date   Chicken pox    Headache(784.0)     Family History  Problem Relation Age of Onset   Hypertension Father    Hyperlipidemia Father    Diabetes Maternal Grandmother        type ll   Diabetes Maternal Grandfather        type ll   Stroke Paternal Grandmother    Hypertension Paternal Grandmother    Breast cancer Paternal Grandmother        postmenopausal   Cancer Paternal Grandfather        prostate   Hyperlipidemia Paternal Grandfather     Social History   Socioeconomic History   Marital status: Single    Spouse name: Not on file  Number of children: Not on file   Years of education: Not on file   Highest education level: Not on file  Occupational History   Not on file  Tobacco Use   Smoking status: Never   Smokeless tobacco: Never  Vaping Use   Vaping Use: Never used  Substance and Sexual Activity   Alcohol use: Yes    Alcohol/week: 10.0 - 11.0 standard drinks    Types: 6 - 7 Cans of beer, 4 Glasses of wine per week    Comment: Trying to cut back   Drug use: Yes    Types: Marijuana   Sexual activity: Not on file  Other Topics Concern   Not on file  Social History Narrative   Not on file   Social Determinants of Health   Financial Resource Strain: Not on file  Food Insecurity: Not on file  Transportation Needs: Not on file  Physical Activity: Not on file  Stress: Not on file  Social Connections: Not on file  Intimate Partner Violence: Not on file    Past Medical History, Surgical history, Social history, and Family history were reviewed and updated as  appropriate.   Please see review of systems for further details on the patient's review from today.   Objective:   Physical Exam:  There were no vitals taken for this visit.  Physical Exam Neurological:     Mental Status: She is alert and oriented to person, place, and time.     Cranial Nerves: No dysarthria.  Psychiatric:        Attention and Perception: Attention normal.        Mood and Affect: Mood is anxious. Mood is not depressed. Affect is not tearful.        Speech: Speech normal.        Behavior: Behavior is cooperative.        Thought Content: Thought content normal. Thought content is not paranoid or delusional. Thought content does not include homicidal or suicidal ideation. Thought content does not include suicidal plan.        Cognition and Memory: Cognition and memory normal.        Judgment: Judgment normal.     Comments: insight good. Better overall.    Lab Review:     Component Value Date/Time   NA 136 07/05/2021 0950   K 4.6 07/05/2021 0950   CL 101 07/05/2021 0950   CO2 28 07/05/2021 0950   GLUCOSE 104 (H) 07/05/2021 0950   BUN 9 07/05/2021 0950   CREATININE 0.73 07/05/2021 0950   CALCIUM 9.3 07/05/2021 0950   PROT 7.8 07/05/2021 0950   ALBUMIN 4.4 07/05/2021 0950   AST 23 07/05/2021 0950   ALT 28 07/05/2021 0950   ALKPHOS 55 07/05/2021 0950   BILITOT 0.3 07/05/2021 0950       Component Value Date/Time   WBC 8.0 07/05/2021 0950   RBC 4.64 07/05/2021 0950   HGB 12.9 07/05/2021 0950   HCT 40.2 07/05/2021 0950   PLT 353.0 07/05/2021 0950   MCV 86.6 07/05/2021 0950   MCHC 32.2 07/05/2021 0950   RDW 16.8 (H) 07/05/2021 0950   LYMPHSABS 1.3 07/05/2021 0950   MONOABS 0.5 07/05/2021 0950   EOSABS 0.3 07/05/2021 0950   BASOSABS 0.0 07/05/2021 0950    No results found for: POCLITH, LITHIUM   No results found for: PHENYTOIN, PHENOBARB, VALPROATE, CBMZ   .res Assessment: Plan:    Bipolar 1 disorder, mixed, moderate (HCC) - Plan: brexpiprazole  (  REXULTI) 2 MG TABS tablet  Generalized anxiety disorder - Plan: cloNIDine (CATAPRES) 0.1 MG tablet  Panic disorder with agoraphobia - Plan: cloNIDine (CATAPRES) 0.1 MG tablet   Greater than 50% of 30 min video face to face time with patient was spent on counseling and coordination of care. We discussed Recently diagnosed bipolar disorder mixed.  She also has acknowledged problem with alcohol.  We discussed treatment options.  She is not currently missing work. She has had more problems with depression and anxiety At December appt and had just stopped sertraline and Rexulti abruptly about a week or so ago.  She no SSRI withdrawal.  She does not want to continue sertraline because of emotional blunting which has resolved off the sertraline.  Overall much improved mood stability and anxiety and sleep with return to Rexulti 2 mg daily and the addition of clonidine 0.1 mg nightly and, when she remembers, 0.05 mg every morning.  She is struggling to become more consistent with the morning dose.  However already she has had improvement in sleep and wakes up with less anxiety and dread as a result of the clonidine at night.  She is tolerating the meds well .   Discussed potential metabolic side effects associated with atypical antipsychotics, as well as potential risk for movement side effects. Advised pt to contact office if movement side effects occur.   On normal dosage of Rexulti 2 mg.  This is being used off label as a mood stabilizer.  So far it is been effective.  Call if there is any development of manic symptoms or worsening mood swings.  Continue Rexulti 2 mg daily Better with Clonidine 0.1 mg  1/2 tablet in AM and 1 tablet at night  Follow-up 2-3 mos  Meredith Staggersarey Cottle, MD, DFAPA  Please see After Visit Summary for patient specific instructions.  No future appointments.   No orders of the defined types were placed in this encounter.      -------------------------------

## 2021-09-03 ENCOUNTER — Other Ambulatory Visit: Payer: Self-pay | Admitting: Psychiatry

## 2021-09-03 DIAGNOSIS — F3162 Bipolar disorder, current episode mixed, moderate: Secondary | ICD-10-CM

## 2021-09-14 ENCOUNTER — Telehealth: Payer: Self-pay

## 2021-09-14 NOTE — Telephone Encounter (Signed)
Prior Authorization submitted and approved for REXULTI 2 MG effective 08/30/2021-08/30/2024 with [facility]. ?

## 2021-09-27 ENCOUNTER — Encounter: Payer: Self-pay | Admitting: Family Medicine

## 2021-12-13 ENCOUNTER — Other Ambulatory Visit: Payer: Self-pay | Admitting: Psychiatry

## 2021-12-13 DIAGNOSIS — F3162 Bipolar disorder, current episode mixed, moderate: Secondary | ICD-10-CM

## 2021-12-14 NOTE — Telephone Encounter (Signed)
Lvm for pt to call and schedule

## 2021-12-14 NOTE — Telephone Encounter (Signed)
Please call to schedule an appt, last seen 1/31 with RTC in 2-3 mo.

## 2022-01-10 ENCOUNTER — Other Ambulatory Visit: Payer: Self-pay | Admitting: Psychiatry

## 2022-01-10 DIAGNOSIS — F3162 Bipolar disorder, current episode mixed, moderate: Secondary | ICD-10-CM

## 2022-01-24 ENCOUNTER — Other Ambulatory Visit: Payer: Self-pay | Admitting: Psychiatry

## 2022-01-24 DIAGNOSIS — F4001 Agoraphobia with panic disorder: Secondary | ICD-10-CM

## 2022-01-24 DIAGNOSIS — F411 Generalized anxiety disorder: Secondary | ICD-10-CM

## 2022-03-29 ENCOUNTER — Ambulatory Visit (INDEPENDENT_AMBULATORY_CARE_PROVIDER_SITE_OTHER): Payer: BC Managed Care – PPO | Admitting: Psychiatry

## 2022-03-29 ENCOUNTER — Encounter: Payer: Self-pay | Admitting: Psychiatry

## 2022-03-29 DIAGNOSIS — F411 Generalized anxiety disorder: Secondary | ICD-10-CM

## 2022-03-29 DIAGNOSIS — F3162 Bipolar disorder, current episode mixed, moderate: Secondary | ICD-10-CM | POA: Diagnosis not present

## 2022-03-29 DIAGNOSIS — F4001 Agoraphobia with panic disorder: Secondary | ICD-10-CM | POA: Diagnosis not present

## 2022-03-29 MED ORDER — BREXPIPRAZOLE 2 MG PO TABS: 2.0000 mg | ORAL_TABLET | Freq: Every day | ORAL | 1 refills | Status: DC

## 2022-03-29 NOTE — Progress Notes (Signed)
[name] [id number] 03-May-1991 31 y.o.   Subjective:   Patient ID:  [name] is a 31 y.o. (DOB [birth year]) female.  Chief Complaint:  Chief Complaint  Patient presents with   Follow-up    Bipolar 1 disorder, mixed, moderate ([employer])    HPI [name] presents to the office today for follow-up of newly diagnosed bipolar disorder mixed, panic disorder, generalized anxiety disorder at her initial office visit August 19, 2018  At that visit fluoxetine was changed to sertraline for better antianxiety effect and increased to 100 mg daily, Abilify was stopped because of akathisia and Rexulti was started off label for mood stabilization at 1 mg to be increased to 2 mg daily.   visit April 2020.  No meds were changed.  But because of recent med changes she was encouraged to come back in 6 weeks.  That did not happen. Consistent with meds since April except missing a week.  visit October 2020.  She asked for trial of increasing sertraline to 150 mg to further help depression.  She continued Rexulti 2 mg.  She was to follow-up in 6 weeks but did not do so.  08/2019 appointment with the following noted: No meds were changed. Really welll with extra sertraline.  Helped a lot.  Depression now under control.  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  Anxiety is manageable.  No manic sx and mood stabilization is better with typically one worse day/week.  No missed work DT depression.  Sleep 8 hour weekdays and less excessive on weekend.  Always needed a lot of sleep.  No anger problems.  Irritable better.  04/15/2020 appointment with the following noted: Patient reports stable mood and denies depressed or irritable moods.  No mania. Patient denies any recent difficulty with anxiety. No panic in a year. Patient denies difficulty with sleep initiation or maintenance. Requires a lot of sleep and has a lot of dreams.  Denies appetite  disturbance.  Patient reports that energy and motivation have been good.  Patient denies any difficulty with concentration.  Patient denies any suicidal ideation. Pleased with meds.  Extra 50 mg sertraline made a big difference. No other SE. Wonders about history of diarrhea, not debilitating. Predates sertraline  06/02/21 appt noted: Doing fine except recently a little more anxiety and depression and anxiety dreams. Stopped sertraline and Rexulti for the last week.  Worried she might be drinking too much  and that it was interacting with school.  Trying to cut back on drinking.  Drinking problem for some years.   Stopped sertraline 150 mg and Rexulti 2 abruptly but denies withdrawal.   Feeling more depressed and anxious since October. Teacher and it's difficult. Seasonal depression too. Anxiety is worse in AM regardless of meds or alcohol.  Feels uneasy and not wanting to get day started. Plan: Restart Rexulti 2 mg daily Clonidine 0.1 mg 1/2 tablet at night for 2 nights then 1 at night for 7 nights then 1/2 tablet in AM and 1 tablet at night DC sertraline because of lack of sufficient benefit as well as disturbing dreams and suppressed tearfulness  07/19/2021 appointment with the following noted: Forgets am clonidine often 0.05 but remembers 0.1 mg HS with less am anxiety and dread and sleeping really well.  Dreams are better with clonidine.  Stopped drinking weeknights and sleep is better.  But was irritable in the am last week but it gets  better. Mood overall is OK.  School is huge stressor but handling it well.   No SE that are a problem. Less anxious.  Depression is not high.  Better able to cry off the sertraline and did cry more over th holidays right after stopping the sertraline. HA manageable and less in AM since stopped alcohol. No concerns with Rexulti. Plan: Continue Rexulti 2 mg daily Better with Clonidine 0.1 mg  1/2 tablet in AM and 1 tablet at night  03/29/2022 appointment  noted: Still pleased with meds.  Could tell if misses Rexulti a couple of days. No major mood swings. Anxiety is manageable. 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 Sleep is good.  10 hours. No panic.  Anxiety is pretty good. Doing things she couldn't do before DT anxiety. Able to enjoy things.  Socializes.    Past Psychiatric Medication Trials: Fluoxetine 40 with Abilify 5 mg akathisia,  Lexapro increased anxiety for 3 weeks. Zoloft 150 with Rexulti 2 mg good Last significant manic episode mid 2018.  History of a lot of mixed symptoms.  Review of Systems:  Review of Systems  Constitutional:  Negative for fatigue.  HENT:  Positive for postnasal drip.   Gastrointestinal:  Negative for diarrhea.  Musculoskeletal:  Negative for myalgias.  Neurological:  Negative for dizziness, tremors, weakness and headaches.  Psychiatric/Behavioral:  Negative for agitation, behavioral problems, confusion, decreased concentration, dysphoric mood, hallucinations, self-injury, sleep disturbance and suicidal ideas. The patient is not nervous/anxious and is not hyperactive.     Medications: I have reviewed the patient's current medications.  Current Outpatient Medications  Medication Sig Dispense Refill   cloNIDine (CATAPRES) 0.1 MG tablet TAKE 1 TABLET(0.1 MG) BY MOUTH TWICE DAILY 180 tablet 1   ibuprofen (ADVIL) 200 MG tablet Take 200 mg by mouth every 6 (six) hours as needed.     Multiple Vitamin (MULTIVITAMIN) tablet Take 1 tablet by mouth daily.     REXULTI 2 MG TABS tablet TAKE 1 TABLET(2 MG) BY MOUTH DAILY 30 tablet 1   No current facility-administered medications for this visit.    Medication Side Effects: Other: no restlessness like abilify.  Allergies: No Known Allergies  Past Medical History:  Diagnosis Date   Chicken pox    Headache(784.0)     Family History  Problem Relation Age of Onset   Hypertension Father    Hyperlipidemia Father    Diabetes Maternal Grandmother         type ll   Diabetes Maternal Grandfather        type ll   Stroke Paternal Grandmother    Hypertension Paternal Grandmother    Breast cancer Paternal Grandmother        postmenopausal   Cancer Paternal Grandfather        prostate   Hyperlipidemia Paternal Grandfather     Social History   Socioeconomic History   Marital status: Single    Spouse name: Not on file   Number of children: Not on file   Years of education: Not on file   Highest education level: Not on file  Occupational History   Not on file  Tobacco Use   Smoking status: Never   Smokeless tobacco: Never  Vaping Use   Vaping Use: Never used  Substance and Sexual Activity   Alcohol use: Yes    Alcohol/week: 10.0 - 11.0 standard drinks of alcohol    Types: 6 - 7 Cans of beer, 4 Glasses of wine per week  Comment: Trying to cut back   Drug use: Yes    Types: Marijuana   Sexual activity: Not on file  Other Topics Concern   Not on file  Social History Narrative   Not on file   Social Determinants of Health   Financial Resource Strain: Not on file  Food Insecurity: Not on file  Transportation Needs: Not on file  Physical Activity: Not on file  Stress: Not on file  Social Connections: Not on file  Intimate Partner Violence: Not on file    Past Medical History, Surgical history, Social history, and Family history were reviewed and updated as appropriate.   Please see review of systems for further details on the patient's review from today.   Objective:   Physical Exam:  There were no vitals taken for this visit.  Physical Exam Neurological:     Mental Status: She is alert and oriented to person, place, and time.     Cranial Nerves: No dysarthria.  Psychiatric:        Attention and Perception: Attention normal.        Mood and Affect: Mood is not anxious or depressed. Affect is not tearful.        Speech: Speech normal.        Behavior: Behavior is cooperative.        Thought Content: Thought  content normal. Thought content is not paranoid or delusional. Thought content does not include homicidal or suicidal ideation. Thought content does not include suicidal plan.        Cognition and Memory: Cognition and memory normal.        Judgment: Judgment normal.     Comments: insight good. Better overall.     Lab Review:     Component Value Date/Time   NA 136 07/05/2021 0950   K 4.6 07/05/2021 0950   CL 101 07/05/2021 0950   CO2 28 07/05/2021 0950   GLUCOSE 104 (H) 07/05/2021 0950   BUN 9 07/05/2021 0950   CREATININE 0.73 07/05/2021 0950   CALCIUM 9.3 07/05/2021 0950   PROT 7.8 07/05/2021 0950   ALBUMIN 4.4 07/05/2021 0950   AST 23 07/05/2021 0950   ALT 28 07/05/2021 0950   ALKPHOS 55 07/05/2021 0950   BILITOT 0.3 07/05/2021 0950       Component Value Date/Time   WBC 8.0 07/05/2021 0950   RBC 4.64 07/05/2021 0950   HGB 12.9 07/05/2021 0950   HCT 40.2 07/05/2021 0950   PLT 353.0 07/05/2021 0950   MCV 86.6 07/05/2021 0950   MCHC 32.2 07/05/2021 0950   RDW 16.8 (H) 07/05/2021 0950   LYMPHSABS 1.3 07/05/2021 0950   MONOABS 0.5 07/05/2021 0950   EOSABS 0.3 07/05/2021 0950   BASOSABS 0.0 07/05/2021 0950    No results found for: "POCLITH", "LITHIUM"   No results found for: "PHENYTOIN", "PHENOBARB", "VALPROATE", "CBMZ"   .res Assessment: Plan:    Bipolar 1 disorder, mixed, moderate (HCC)  Generalized anxiety disorder  Panic disorder with agoraphobia   We discussed Recently diagnosed bipolar disorder mixed.  She also has acknowledged problem with alcohol.  We discussed treatment options.  She is not currently missing work.  Stopped alcohol.  Overall much improved mood stability and anxiety and sleep with return to Rexulti 2 mg daily and the addition of clonidine 0.1 mg nightly and, when she remembers, 0.05 mg every morning.  She is struggling to become more consistent with the morning dose.  However already she has had improvement in  sleep and wakes up with less  anxiety and dread as a result of the clonidine at night.  She is tolerating the meds well .   Discussed potential metabolic side effects associated with atypical antipsychotics, as well as potential risk for movement side effects. Advised pt to contact office if movement side effects occur.   On normal dosage of Rexulti 2 mg.  This is being used off label as a mood stabilizer.  So far it is been effective.  She understands it is off label.  Call if there is any development of manic symptoms or worsening mood swings.  Continue Rexulti 2 mg daily Better with Clonidine 0.1 mg  1/2 tablet in AM and 1 tablet at night.  Helped anxiety  Follow-up 6 mos  Lynder Parents, MD, DFAPA  Please see After Visit Summary for patient specific instructions.  No future appointments.   No orders of the defined types were placed in this encounter.      -------------------------------

## 2022-07-27 ENCOUNTER — Telehealth: Payer: Self-pay | Admitting: Psychiatry

## 2022-07-27 NOTE — Telephone Encounter (Signed)
Good RX is only effective for generics.  The pharmacy is not running the card right.  Either go to a different pharmacy or contact rep to go tell the pharmacy how to run it.

## 2022-07-27 NOTE — Telephone Encounter (Signed)
Pt stated the coupon code was expired so she went to the site and used that one but the pharmacy stated she could not use it ,it showed an error on their end.I suggested she try good rx unless you have another option for her

## 2022-07-27 NOTE — Telephone Encounter (Signed)
LVM to rtc 

## 2022-07-27 NOTE — Telephone Encounter (Signed)
I'll call the pharmacy tomorrow.

## 2022-07-27 NOTE — Telephone Encounter (Signed)
Pt LVM @ 9:07a.  She is having a problem with a coupon code for [facility].  She is out of meds.  She needs someone to call her back to help her.  Next appt 4/11

## 2022-07-28 NOTE — Telephone Encounter (Signed)
LVM to RC 

## 2022-07-28 NOTE — Telephone Encounter (Signed)
Thank you :)

## 2022-07-28 NOTE — Telephone Encounter (Signed)
Pharmacy said patient had used a one-time use copay card. I gave her another card and it went thru for $5.00. She said they did not have the medication in stock, but could get by Monday.  The card # will be on file for future refills.

## 2022-09-16 ENCOUNTER — Other Ambulatory Visit: Payer: Self-pay | Admitting: Psychiatry

## 2022-09-16 DIAGNOSIS — F411 Generalized anxiety disorder: Secondary | ICD-10-CM

## 2022-09-16 DIAGNOSIS — F4001 Agoraphobia with panic disorder: Secondary | ICD-10-CM

## 2022-09-18 ENCOUNTER — Other Ambulatory Visit: Payer: Self-pay | Admitting: Psychiatry

## 2022-09-18 DIAGNOSIS — F4001 Agoraphobia with panic disorder: Secondary | ICD-10-CM

## 2022-09-18 DIAGNOSIS — F411 Generalized anxiety disorder: Secondary | ICD-10-CM

## 2022-09-28 ENCOUNTER — Encounter: Payer: Self-pay | Admitting: Psychiatry

## 2022-09-28 ENCOUNTER — Ambulatory Visit (INDEPENDENT_AMBULATORY_CARE_PROVIDER_SITE_OTHER): Payer: BC Managed Care – PPO | Admitting: Psychiatry

## 2022-09-28 DIAGNOSIS — F3162 Bipolar disorder, current episode mixed, moderate: Secondary | ICD-10-CM

## 2022-09-28 DIAGNOSIS — F10220 Alcohol dependence with intoxication, uncomplicated: Secondary | ICD-10-CM | POA: Diagnosis not present

## 2022-09-28 DIAGNOSIS — F4001 Agoraphobia with panic disorder: Secondary | ICD-10-CM | POA: Diagnosis not present

## 2022-09-28 DIAGNOSIS — F411 Generalized anxiety disorder: Secondary | ICD-10-CM

## 2022-09-28 MED ORDER — BUPROPION HCL 100 MG PO TABS: ORAL_TABLET | ORAL | 1 refills | Status: DC

## 2022-09-28 NOTE — Progress Notes (Signed)
[name] [id number] [birth year] 32 y.o.   Subjective:   Patient ID:  [name] is a 32 y.o. (DOB 06-Jan-1991) female.  Chief Complaint:  Chief Complaint  Patient presents with   Follow-up    HPI [name] presents to the office today for follow-up of newly diagnosed bipolar disorder mixed, panic disorder, generalized anxiety disorder at her initial office visit August 19, 2018  At that visit fluoxetine was changed to sertraline for better antianxiety effect and increased to 100 mg daily, Abilify was stopped because of akathisia and Rexulti was started off label for mood stabilization at 1 mg to be increased to 2 mg daily.   visit April 2020.  No meds were changed.  But because of recent med changes she was encouraged to come back in 6 weeks.  That did not happen. Consistent with meds since April except missing a week.  visit October 2020.  She asked for trial of increasing sertraline to 150 mg to further help depression.  She continued Rexulti 2 mg.  She was to follow-up in 6 weeks but did not do so.  08/2019 appointment with the following noted: No meds were changed. Really welll with extra sertraline.  Helped a lot.  Depression now under control.  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  Anxiety is manageable.  No manic sx and mood stabilization is better with typically one worse day/week.  No missed work DT depression.  Sleep 8 hour weekdays and less excessive on weekend.  Always needed a lot of sleep.  No anger problems.  Irritable better.  04/15/2020 appointment with the following noted: Patient reports stable mood and denies depressed or irritable moods.  No mania. Patient denies any recent difficulty with anxiety. No panic in a year. Patient denies difficulty with sleep initiation or maintenance. Requires a lot of sleep and has a lot of dreams.  Denies appetite disturbance.  Patient reports that energy and  motivation have been good.  Patient denies any difficulty with concentration.  Patient denies any suicidal ideation. Pleased with meds.  Extra 50 mg sertraline made a big difference. No other SE. Wonders about history of diarrhea, not debilitating. Predates sertraline  06/02/21 appt noted: Doing fine except recently a little more anxiety and depression and anxiety dreams. Stopped sertraline and Rexulti for the last week.  Worried she might be drinking too much  and that it was interacting with school.  Trying to cut back on drinking.  Drinking problem for some years.   Stopped sertraline 150 mg and Rexulti 2 abruptly but denies withdrawal.   Feeling more depressed and anxious since October. Teacher and it's difficult. Seasonal depression too. Anxiety is worse in AM regardless of meds or alcohol.  Feels uneasy and not wanting to get day started. Plan: Restart Rexulti 2 mg daily Clonidine 0.1 mg 1/2 tablet at night for 2 nights then 1 at night for 7 nights then 1/2 tablet in AM and 1 tablet at night DC sertraline because of lack of sufficient benefit as well as disturbing dreams and suppressed tearfulness  07/19/2021 appointment with the following noted: Forgets am clonidine often 0.05 but remembers 0.1 mg HS with less am anxiety and dread and sleeping really well.  Dreams are better with clonidine.  Stopped drinking weeknights and sleep is better.  But was irritable in the am last week but it gets better. Mood overall is OK.  School is huge  stressor but handling it well.   No SE that are a problem. Less anxious.  Depression is not high.  Better able to cry off the sertraline and did cry more over th holidays right after stopping the sertraline. HA manageable and less in AM since stopped alcohol. No concerns with Rexulti. Plan: Continue Rexulti 2 mg daily Better with Clonidine 0.1 mg  1/2 tablet in AM and 1 tablet at night  03/29/2022 appointment noted: Still pleased with meds.  Could tell  if misses Rexulti a couple of days. No major mood swings. Anxiety is manageable. 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 Sleep is good.  10 hours. No panic.  Anxiety is pretty good. Doing things she couldn't do before DT anxiety. Able to enjoy things.  Socializes.   Plan no med changes  09/28/22 appt noted: Brief period off DT coupon code but back on it. Re: Rexulti Continues clonidine 0.1 BID. No othert meds. Been doing ok but more dep concerns.  Some situational.  But weekends doesn't want to do antything except sleep excessively.  Seems like hitting a wall and unable to get up.  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  Reduced interest in things.   Some worry over poss car crash.   Alcohol not worse.  Avg 5-6/d.  Not affecting her the next day.    Past Psychiatric Medication Trials: Fluoxetine 40 with Abilify 5 mg akathisia,  Lexapro increased anxiety for 3 weeks. Zoloft 150 with Rexulti 2 mg good; sertraline couldn't cry Last significant manic episode mid 2018.  History of a lot of mixed symptoms.  Review of Systems:  Review of Systems  Constitutional:  Negative for fatigue.  HENT:  Positive for postnasal drip.   Gastrointestinal:  Negative for diarrhea.  Musculoskeletal:  Negative for myalgias.  Neurological:  Negative for dizziness, tremors, weakness and headaches.  Psychiatric/Behavioral:  Positive for dysphoric mood. Negative for agitation, behavioral problems, confusion, decreased concentration, hallucinations, self-injury, sleep disturbance and suicidal ideas. The patient is not nervous/anxious and is not hyperactive.     Medications: I have reviewed the patient's current medications.  Current Outpatient Medications  Medication Sig Dispense Refill   brexpiprazole (REXULTI) 2 MG TABS tablet Take 1 tablet (2 mg total) by mouth daily. 90 tablet 1   buPROPion (WELLBUTRIN) 100 MG tablet 1/2 tablet 3 times daily for 1 weekthen 1 tablet 3 times  daily 90 tablet 1   cloNIDine (CATAPRES) 0.1 MG tablet TAKE 1 TABLET(0.1 MG) BY MOUTH TWICE DAILY 60 tablet 0   ibuprofen (ADVIL) 200 MG tablet Take 200 mg by mouth every 6 (six) hours as needed.     Multiple Vitamin (MULTIVITAMIN) tablet Take 1 tablet by mouth daily.     No current facility-administered medications for this visit.    Medication Side Effects: Other: no restlessness like abilify.  Allergies: No Known Allergies  Past Medical History:  Diagnosis Date   Chicken pox    Headache(784.0)     Family History  Problem Relation Age of Onset   Hypertension Father    Hyperlipidemia Father    Diabetes Maternal Grandmother        type ll   Diabetes Maternal Grandfather        type ll   Stroke Paternal Grandmother    Hypertension Paternal Grandmother    Breast cancer Paternal Grandmother        postmenopausal   Cancer Paternal Grandfather  prostate   Hyperlipidemia Paternal Grandfather     Social History   Socioeconomic History   Marital status: Single    Spouse name: Not on file   Number of children: Not on file   Years of education: Not on file   Highest education level: Not on file  Occupational History   Not on file  Tobacco Use   Smoking status: Never   Smokeless tobacco: Never  Vaping Use   Vaping Use: Never used  Substance and Sexual Activity   Alcohol use: Yes    Alcohol/week: 10.0 - 11.0 standard drinks of alcohol    Types: 6 - 7 Cans of beer, 4 Glasses of wine per week    Comment: Trying to cut back   Drug use: Yes    Types: Marijuana   Sexual activity: Not on file  Other Topics Concern   Not on file  Social History Narrative   Not on file   Social Determinants of Health   Financial Resource Strain: Not on file  Food Insecurity: Not on file  Transportation Needs: Not on file  Physical Activity: Not on file  Stress: Not on file  Social Connections: Not on file  Intimate Partner Violence: Not on file    Past Medical History,  Surgical history, Social history, and Family history were reviewed and updated as appropriate.   Please see review of systems for further details on the patient's review from today.   Objective:   Physical Exam:  There were no vitals taken for this visit.  Physical Exam Neurological:     Mental Status: She is alert and oriented to person, place, and time.     Cranial Nerves: No dysarthria.  Psychiatric:        Attention and Perception: Attention normal.        Mood and Affect: Mood is depressed. Mood is not anxious. Affect is not tearful.        Speech: Speech normal.        Behavior: Behavior is cooperative.        Thought Content: Thought content normal. Thought content is not paranoid or delusional. Thought content does not include homicidal or suicidal ideation. Thought content does not include suicidal plan.        Cognition and Memory: Cognition and memory normal.        Judgment: Judgment normal.     Comments: insight good.      Lab Review:     Component Value Date/Time   NA 136 07/05/2021 0950   K 4.6 07/05/2021 0950   CL 101 07/05/2021 0950   CO2 28 07/05/2021 0950   GLUCOSE 104 (H) 07/05/2021 0950   BUN 9 07/05/2021 0950   CREATININE 0.73 07/05/2021 0950   CALCIUM 9.3 07/05/2021 0950   PROT 7.8 07/05/2021 0950   ALBUMIN 4.4 07/05/2021 0950   AST 23 07/05/2021 0950   ALT 28 07/05/2021 0950   ALKPHOS 55 07/05/2021 0950   BILITOT 0.3 07/05/2021 0950       Component Value Date/Time   WBC 8.0 07/05/2021 0950   RBC 4.64 07/05/2021 0950   HGB 12.9 07/05/2021 0950   HCT 40.2 07/05/2021 0950   PLT 353.0 07/05/2021 0950   MCV 86.6 07/05/2021 0950   MCHC 32.2 07/05/2021 0950   RDW 16.8 (H) 07/05/2021 0950   LYMPHSABS 1.3 07/05/2021 0950   MONOABS 0.5 07/05/2021 0950   EOSABS 0.3 07/05/2021 0950   BASOSABS 0.0 07/05/2021 0950    No  results found for: "POCLITH", "LITHIUM"   No results found for: "PHENYTOIN", "PHENOBARB", "VALPROATE", "CBMZ"    .res Assessment: Plan:    Bipolar 1 disorder, mixed, moderate  Generalized anxiety disorder  Panic disorder with agoraphobia  Alcohol dependence with uncomplicated intoxication   Pill swallowing phobia  We discussed Recently diagnosed bipolar disorder mixed.   No mixed sx controlled but is depressed.  She also has acknowledged problem with alcohol. Disc this can cause depression and current amount is excessive.   We discussed treatment options.  She is not currently missing work.  Reduce alcohol or stop if you can't.       Overall much improved mood stability and anxiety and sleep with return to Rexulti 2 mg daily and the addition of clonidine 0.1 mg nightly and, when she remembers, 0.05 mg every morning.  She is struggling to become more consistent with the morning dose.  However already she has had improvement in sleep and wakes up with less anxiety and dread as a result of the clonidine at night.  She is tolerating the meds well .   Discussed potential metabolic side effects associated with atypical antipsychotics, as well as potential risk for movement side effects. Advised pt to contact office if movement side effects occur.   On normal dosage of Rexulti 2 mg.  This is being used off label as a mood stabilizer.  So far it is been effective.  She understands it is off label.  Call if there is any development of manic symptoms or worsening mood swings.  Continue Rexulti 2 mg daily Better with Clonidine 0.1 mg  1/2 tablet in AM and 1 tablet at night.  Helped anxiety For depression Wellbutrin but has to be IR bc phobia and needs to crush pills so100 mg 1/2 TID for a week then 1 TID  Follow-up 2 mos  Meredith Staggers, MD, DFAPA  Please see After Visit Summary for patient specific instructions.  No future appointments.   No orders of the defined types were placed in this encounter.      -------------------------------

## 2022-11-03 ENCOUNTER — Other Ambulatory Visit: Payer: Self-pay | Admitting: Psychiatry

## 2022-11-03 DIAGNOSIS — F411 Generalized anxiety disorder: Secondary | ICD-10-CM

## 2022-11-03 DIAGNOSIS — F4001 Agoraphobia with panic disorder: Secondary | ICD-10-CM

## 2022-11-05 ENCOUNTER — Other Ambulatory Visit: Payer: Self-pay | Admitting: Psychiatry

## 2022-11-05 DIAGNOSIS — F4001 Agoraphobia with panic disorder: Secondary | ICD-10-CM

## 2022-11-05 DIAGNOSIS — F411 Generalized anxiety disorder: Secondary | ICD-10-CM

## 2022-11-19 ENCOUNTER — Other Ambulatory Visit: Payer: Self-pay | Admitting: Psychiatry

## 2022-11-19 DIAGNOSIS — F3162 Bipolar disorder, current episode mixed, moderate: Secondary | ICD-10-CM

## 2022-11-21 ENCOUNTER — Other Ambulatory Visit: Payer: Self-pay | Admitting: Psychiatry

## 2022-11-21 DIAGNOSIS — F3162 Bipolar disorder, current episode mixed, moderate: Secondary | ICD-10-CM

## 2022-11-27 ENCOUNTER — Ambulatory Visit: Payer: BC Managed Care – PPO | Admitting: Psychiatry

## 2022-12-14 ENCOUNTER — Other Ambulatory Visit: Payer: Self-pay | Admitting: Psychiatry

## 2022-12-14 DIAGNOSIS — F4001 Agoraphobia with panic disorder: Secondary | ICD-10-CM

## 2022-12-14 DIAGNOSIS — F411 Generalized anxiety disorder: Secondary | ICD-10-CM

## 2023-01-10 ENCOUNTER — Ambulatory Visit: Payer: BC Managed Care – PPO | Admitting: Psychiatry

## 2023-01-11 ENCOUNTER — Ambulatory Visit: Payer: BC Managed Care – PPO | Admitting: Psychiatry

## 2023-01-18 ENCOUNTER — Other Ambulatory Visit: Payer: Self-pay | Admitting: Psychiatry

## 2023-01-18 DIAGNOSIS — F3162 Bipolar disorder, current episode mixed, moderate: Secondary | ICD-10-CM

## 2023-01-19 ENCOUNTER — Other Ambulatory Visit: Payer: Self-pay | Admitting: Psychiatry

## 2023-01-19 DIAGNOSIS — F411 Generalized anxiety disorder: Secondary | ICD-10-CM

## 2023-01-19 DIAGNOSIS — F4001 Agoraphobia with panic disorder: Secondary | ICD-10-CM

## 2023-02-22 ENCOUNTER — Ambulatory Visit: Payer: BC Managed Care – PPO | Admitting: Psychiatry

## 2023-03-13 ENCOUNTER — Ambulatory Visit: Payer: BC Managed Care – PPO | Admitting: Psychiatry

## 2023-03-13 ENCOUNTER — Encounter: Payer: Self-pay | Admitting: Psychiatry

## 2023-03-13 DIAGNOSIS — F10220 Alcohol dependence with intoxication, uncomplicated: Secondary | ICD-10-CM | POA: Diagnosis not present

## 2023-03-13 DIAGNOSIS — F3162 Bipolar disorder, current episode mixed, moderate: Secondary | ICD-10-CM | POA: Diagnosis not present

## 2023-03-13 DIAGNOSIS — F4001 Agoraphobia with panic disorder: Secondary | ICD-10-CM

## 2023-03-13 DIAGNOSIS — F411 Generalized anxiety disorder: Secondary | ICD-10-CM | POA: Diagnosis not present

## 2023-03-13 MED ORDER — BREXPIPRAZOLE 2 MG PO TABS: 2.0000 mg | ORAL_TABLET | Freq: Every day | ORAL | 1 refills | Status: DC

## 2023-03-13 MED ORDER — BUPROPION HCL 100 MG PO TABS
ORAL_TABLET | ORAL | 1 refills | Status: DC
Start: 2023-03-13 — End: 2023-03-14

## 2023-03-13 MED ORDER — CLONIDINE HCL 0.1 MG PO TABS: 0.1000 mg | ORAL_TABLET | Freq: Two times a day (BID) | ORAL | 1 refills | Status: DC

## 2023-03-13 NOTE — Progress Notes (Signed)
[name] [id number] 31-Jan-1991 32 y.o.   Subjective:   Patient ID:  [name] is a 32 y.o. (DOB 12-22-1990) female.  Chief Complaint:  Chief Complaint  Patient presents with   Follow-up   Depression    HPI [name] presents to the office today for follow-up of newly diagnosed bipolar disorder mixed, panic disorder, generalized anxiety disorder at her initial office visit August 19, 2018  At that visit fluoxetine was changed to sertraline for better antianxiety effect and increased to 100 mg daily, Abilify was stopped because of akathisia and Rexulti was started off label for mood stabilization at 1 mg to be increased to 2 mg daily.   visit April 2020.  No meds were changed.  But because of recent med changes she was encouraged to come back in 6 weeks.  That did not happen. Consistent with meds since April except missing a week.  visit October 2020.  She asked for trial of increasing sertraline to 150 mg to further help depression.  She continued Rexulti 2 mg.  She was to follow-up in 6 weeks but did not do so.  08/2019 appointment with the following noted: No meds were changed. Really welll with extra sertraline.  Helped a lot.  Depression now under control.  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  Anxiety is manageable.  No manic sx and mood stabilization is better with typically one worse day/week.  No missed work DT depression.  Sleep 8 hour weekdays and less excessive on weekend.  Always needed a lot of sleep.  No anger problems.  Irritable better.  04/15/2020 appointment with the following noted: Patient reports stable mood and denies depressed or irritable moods.  No mania. Patient denies any recent difficulty with anxiety. No panic in a year. Patient denies difficulty with sleep initiation or maintenance. Requires a lot of sleep and has a lot of dreams.  Denies appetite disturbance.  Patient reports that  energy and motivation have been good.  Patient denies any difficulty with concentration.  Patient denies any suicidal ideation. Pleased with meds.  Extra 50 mg sertraline made a big difference. No other SE. Wonders about history of diarrhea, not debilitating. Predates sertraline  06/02/21 appt noted: Doing fine except recently a little more anxiety and depression and anxiety dreams. Stopped sertraline and Rexulti for the last week.  Worried she might be drinking too much  and that it was interacting with school.  Trying to cut back on drinking.  Drinking problem for some years.   Stopped sertraline 150 mg and Rexulti 2 abruptly but denies withdrawal.   Feeling more depressed and anxious since October. Teacher and it's difficult. Seasonal depression too. Anxiety is worse in AM regardless of meds or alcohol.  Feels uneasy and not wanting to get day started. Plan: Restart Rexulti 2 mg daily Clonidine 0.1 mg 1/2 tablet at night for 2 nights then 1 at night for 7 nights then 1/2 tablet in AM and 1 tablet at night DC sertraline because of lack of sufficient benefit as well as disturbing dreams and suppressed tearfulness  07/19/2021 appointment with the following noted: Forgets am clonidine often 0.05 but remembers 0.1 mg HS with less am anxiety and dread and sleeping really well.  Dreams are better with clonidine.  Stopped drinking weeknights and sleep is better.  But was irritable in the am last week but it gets better. Mood overall is OK.  School is huge stressor but handling it well.   No SE that are a problem. Less anxious.  Depression is not high.  Better able to cry off the sertraline and did cry more over th holidays right after stopping the sertraline. HA manageable and less in AM since stopped alcohol. No concerns with Rexulti. Plan: Continue Rexulti 2 mg daily Better with Clonidine 0.1 mg  1/2 tablet in AM and 1 tablet at night  03/29/2022 appointment noted: Still pleased with meds.   Could tell if misses Rexulti a couple of days. No major mood swings. Anxiety is manageable. 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 Sleep is good.  10 hours. No panic.  Anxiety is pretty good. Doing things she couldn't do before DT anxiety. Able to enjoy things.  Socializes.   Plan no med changes  09/28/22 appt noted: Brief period off DT coupon code but back on it. Re: Rexulti Continues clonidine 0.1 BID. No othert meds. Been doing ok but more dep concerns.  Some situational.  But weekends doesn't want to do antything except sleep excessively.  Seems like hitting a wall and unable to get up.  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  Reduced interest in things.   Some worry over poss car crash.   Alcohol not worse.  Avg 5-6/d.  Not affecting her the next day.   Plan: Continue Rexulti 2 mg daily Better with Clonidine 0.1 mg  1/2 tablet in AM and 1 tablet at night.  Helped anxiety For depression Wellbutrin but has to be IR bc phobia and needs to crush pills so100 mg 1/2 TID for a week then 1 TID  03/13/23 appt noted: I feel great.  Best in a long time.  Wellbutrin helped a lot. Still crushing pills.  Hard to swallow pills at age 32 .   Anxiety way lower than usual.  Dep resolved. Easier to keep herself going.  Better energy after work.  Mind clearer.   Emotions not suppressed. Last week started going to the gym again.  Waking up easier.  Less excessive sleep when off work. No SE. Sleep is pretty good.  Sometimes anxiety dreams.  Without reason usually.  But sometimes processing emotional things.   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 Alcohol lessened a little.  Mostly just white claws down from 8 to 4.  Some nights only 2. Doesn't drive after drinking.    Past Psychiatric Medication Trials: Fluoxetine 40 with Abilify 5 mg akathisia,  Lexapro increased anxiety for 3 weeks. Zoloft 150 with Rexulti 2 mg good; sertraline couldn't cry & other SE Last  significant manic episode mid 2018.  History of a lot of mixed symptoms.  Review of Systems:  Review of Systems  Constitutional:  Negative for fatigue.  Gastrointestinal:  Negative for diarrhea.  Musculoskeletal:  Negative for myalgias.  Neurological:  Negative for dizziness, tremors, weakness and headaches.  Psychiatric/Behavioral:  Negative for agitation, behavioral problems, confusion, decreased concentration, dysphoric mood, hallucinations, self-injury, sleep disturbance and suicidal ideas. The patient is not nervous/anxious and is not hyperactive.     Medications: I have reviewed the patient's current medications.  Current Outpatient Medications  Medication Sig Dispense Refill   drospirenone-ethinyl estradiol (YAZ) 3-0.02 MG tablet Take 1 tablet by mouth daily.     ibuprofen (ADVIL) 200 MG tablet Take 200 mg by mouth every 6 (six) hours as needed.     Multiple Vitamin (MULTIVITAMIN) tablet Take 1 tablet by  mouth daily.     brexpiprazole (REXULTI) 2 MG TABS tablet Take 1 tablet (2 mg total) by mouth daily. 90 tablet 1   buPROPion (WELLBUTRIN) 100 MG tablet TAKE 1 TABLET BY MOUTH THREE TIMES DAILY 270 tablet 1   cloNIDine (CATAPRES) 0.1 MG tablet Take 1 tablet (0.1 mg total) by mouth 2 (two) times daily. 180 tablet 1   No current facility-administered medications for this visit.    Medication Side Effects: Other: no restlessness like abilify.  Allergies: No Known Allergies  Past Medical History:  Diagnosis Date   Chicken pox    Headache(784.0)     Family History  Problem Relation Age of Onset   Hypertension Father    Hyperlipidemia Father    Diabetes Maternal Grandmother        type ll   Diabetes Maternal Grandfather        type ll   Stroke Paternal Grandmother    Hypertension Paternal Grandmother    Breast cancer Paternal Grandmother        postmenopausal   Cancer Paternal Grandfather        prostate   Hyperlipidemia Paternal Grandfather     Social History    Socioeconomic History   Marital status: Single    Spouse name: Not on file   Number of children: Not on file   Years of education: Not on file   Highest education level: Not on file  Occupational History   Not on file  Tobacco Use   Smoking status: Never   Smokeless tobacco: Never  Vaping Use   Vaping status: Never Used  Substance and Sexual Activity   Alcohol use: Yes    Alcohol/week: 10.0 - 11.0 standard drinks of alcohol    Types: 6 - 7 Cans of beer, 4 Glasses of wine per week    Comment: Trying to cut back   Drug use: Yes    Types: Marijuana   Sexual activity: Not on file  Other Topics Concern   Not on file  Social History Narrative   Not on file   Social Determinants of Health   Financial Resource Strain: Not on file  Food Insecurity: Not on file  Transportation Needs: Not on file  Physical Activity: Not on file  Stress: Not on file  Social Connections: Not on file  Intimate Partner Violence: Not on file    Past Medical History, Surgical history, Social history, and Family history were reviewed and updated as appropriate.   Please see review of systems for further details on the patient's review from today.   Objective:   Physical Exam:  There were no vitals taken for this visit.  Physical Exam Neurological:     Mental Status: She is alert and oriented to person, place, and time.     Cranial Nerves: No dysarthria.  Psychiatric:        Attention and Perception: Attention normal.        Mood and Affect: Mood is not anxious or depressed. Affect is not tearful.        Speech: Speech normal.        Behavior: Behavior is cooperative.        Thought Content: Thought content normal. Thought content is not paranoid or delusional. Thought content does not include homicidal or suicidal ideation. Thought content does not include suicidal plan.        Cognition and Memory: Cognition and memory normal.        Judgment: Judgment normal.  Comments: insight  good. Dep resolved.      Lab Review:     Component Value Date/Time   NA 136 07/05/2021 0950   K 4.6 07/05/2021 0950   CL 101 07/05/2021 0950   CO2 28 07/05/2021 0950   GLUCOSE 104 (H) 07/05/2021 0950   BUN 9 07/05/2021 0950   CREATININE 0.73 07/05/2021 0950   CALCIUM 9.3 07/05/2021 0950   PROT 7.8 07/05/2021 0950   ALBUMIN 4.4 07/05/2021 0950   AST 23 07/05/2021 0950   ALT 28 07/05/2021 0950   ALKPHOS 55 07/05/2021 0950   BILITOT 0.3 07/05/2021 0950       Component Value Date/Time   WBC 8.0 07/05/2021 0950   RBC 4.64 07/05/2021 0950   HGB 12.9 07/05/2021 0950   HCT 40.2 07/05/2021 0950   PLT 353.0 07/05/2021 0950   MCV 86.6 07/05/2021 0950   MCHC 32.2 07/05/2021 0950   RDW 16.8 (H) 07/05/2021 0950   LYMPHSABS 1.3 07/05/2021 0950   MONOABS 0.5 07/05/2021 0950   EOSABS 0.3 07/05/2021 0950   BASOSABS 0.0 07/05/2021 0950    No results found for: "POCLITH", "LITHIUM"   No results found for: "PHENYTOIN", "PHENOBARB", "VALPROATE", "CBMZ"   .res Assessment: Plan:    Bipolar 1 disorder, mixed, moderate (HCC) - Plan: brexpiprazole (REXULTI) 2 MG TABS tablet, buPROPion (WELLBUTRIN) 100 MG tablet  Generalized anxiety disorder - Plan: cloNIDine (CATAPRES) 0.1 MG tablet  Panic disorder with agoraphobia - Plan: cloNIDine (CATAPRES) 0.1 MG tablet  Alcohol dependence with uncomplicated intoxication (HCC)   Pill swallowing phobia  We discussed Recently diagnosed bipolar disorder mixed.   No mixed sx controlled but is depressed.  She also has acknowledged problem with alcohol.  We discussed treatment options.  She is not currently missing work.  Reduced alcohol by 50%.     No impairment.  Overall much improved mood stability and anxiety and sleep with return to Rexulti 2 mg daily and the addition of clonidine 0.1 mg nightly and, when she remembers, 0.05 mg every morning.  She is struggling to become more consistent with the morning dose.  However already she has had  improvement in sleep and wakes up with less anxiety and dread as a result of the clonidine at night.  She is tolerating the meds well .   Discussed potential metabolic side effects associated with atypical antipsychotics, as well as potential risk for movement side effects. Advised pt to contact office if movement side effects occur.   On normal dosage of Rexulti 2 mg.  This is being used off label as a mood stabilizer.  So far it is been effective.  She understands it is off label.  Still going well.  Call if there is any development of manic symptoms or worsening mood swings.  Continue Rexulti 2 mg daily Better with Clonidine 0.1 mg  1/2 tablet in AM and 1 tablet at night.  Helped anxiety Much better with depression Wellbutrin 100 mg  1 TID  Follow-up 6 mos  Meredith Staggers, MD, DFAPA  Please see After Visit Summary for patient specific instructions.  No future appointments.   No orders of the defined types were placed in this encounter.      -------------------------------

## 2023-03-14 ENCOUNTER — Other Ambulatory Visit: Payer: Self-pay | Admitting: Psychiatry

## 2023-03-14 DIAGNOSIS — F3162 Bipolar disorder, current episode mixed, moderate: Secondary | ICD-10-CM

## 2023-03-19 ENCOUNTER — Other Ambulatory Visit: Payer: Self-pay | Admitting: Psychiatry

## 2023-03-19 DIAGNOSIS — F3162 Bipolar disorder, current episode mixed, moderate: Secondary | ICD-10-CM

## 2023-07-16 ENCOUNTER — Telehealth: Payer: Self-pay | Admitting: Family Medicine

## 2023-09-04 ENCOUNTER — Other Ambulatory Visit: Payer: Self-pay | Admitting: Psychiatry

## 2023-09-04 DIAGNOSIS — F4001 Agoraphobia with panic disorder: Secondary | ICD-10-CM

## 2023-09-04 DIAGNOSIS — F411 Generalized anxiety disorder: Secondary | ICD-10-CM

## 2023-09-10 ENCOUNTER — Ambulatory Visit: Payer: BC Managed Care – PPO | Admitting: Psychiatry

## 2023-09-15 ENCOUNTER — Other Ambulatory Visit: Payer: Self-pay | Admitting: Psychiatry

## 2023-09-15 DIAGNOSIS — F3162 Bipolar disorder, current episode mixed, moderate: Secondary | ICD-10-CM

## 2023-09-16 NOTE — Telephone Encounter (Signed)
 Has appt 3/31.

## 2023-09-17 ENCOUNTER — Encounter: Payer: Self-pay | Admitting: Psychiatry

## 2023-09-17 ENCOUNTER — Ambulatory Visit (INDEPENDENT_AMBULATORY_CARE_PROVIDER_SITE_OTHER): Admitting: Psychiatry

## 2023-09-17 DIAGNOSIS — F4001 Agoraphobia with panic disorder: Secondary | ICD-10-CM | POA: Diagnosis not present

## 2023-09-17 DIAGNOSIS — F411 Generalized anxiety disorder: Secondary | ICD-10-CM

## 2023-09-17 DIAGNOSIS — F10220 Alcohol dependence with intoxication, uncomplicated: Secondary | ICD-10-CM | POA: Diagnosis not present

## 2023-09-17 DIAGNOSIS — F3162 Bipolar disorder, current episode mixed, moderate: Secondary | ICD-10-CM | POA: Diagnosis not present

## 2023-09-17 MED ORDER — CLONIDINE HCL 0.1 MG PO TABS: 0.1000 mg | ORAL_TABLET | Freq: Two times a day (BID) | ORAL | 1 refills | Status: DC

## 2023-09-17 MED ORDER — BUPROPION HCL 100 MG PO TABS: ORAL_TABLET | ORAL | 2 refills | Status: DC

## 2023-09-17 MED ORDER — BREXPIPRAZOLE 2 MG PO TABS: 2.0000 mg | ORAL_TABLET | Freq: Every day | ORAL | 2 refills | Status: DC

## 2023-09-17 NOTE — Progress Notes (Signed)
 BEOLA VASALLO [id number] 1991/04/30 33 y.o.   Subjective:   Patient ID:  [name] is a 33 y.o. (DOB 1991/02/17) female.  Chief Complaint:  Chief Complaint  Patient presents with   Follow-up   Depression   Anxiety    HPI Kimber R Mckeel presents to the office today for follow-up of newly diagnosed bipolar disorder mixed, panic disorder, generalized anxiety disorder at her initial office visit August 19, 2018  At that visit fluoxetine was changed to sertraline for better antianxiety effect and increased to 100 mg daily, Abilify was stopped because of akathisia and Rexulti was started off label for mood stabilization at 1 mg to be increased to 2 mg daily.   visit April 2020.  No meds were changed.  But because of recent med changes she was encouraged to come back in 6 weeks.  That did not happen. Consistent with meds since April except missing a week.  visit October 2020.  She asked for trial of increasing sertraline to 150 mg to further help depression.  She continued Rexulti 2 mg.  She was to follow-up in 6 weeks but did not do so.  08/2019 appointment with the following noted: No meds were changed. Really welll with extra sertraline.  Helped a lot.  Depression now under control.  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  Anxiety is manageable.  No manic sx and mood stabilization is better with typically one worse day/week.  No missed work DT depression.  Sleep 8 hour weekdays and less excessive on weekend.  Always needed a lot of sleep.  No anger problems.  Irritable better.  04/15/2020 appointment with the following noted: Patient reports stable mood and denies depressed or irritable moods.  No mania. Patient denies any recent difficulty with anxiety. No panic in a year. Patient denies difficulty with sleep initiation or maintenance. Requires a lot of sleep and has a lot of dreams.  Denies appetite disturbance.  Patient  reports that energy and motivation have been good.  Patient denies any difficulty with concentration.  Patient denies any suicidal ideation. Pleased with meds.  Extra 50 mg sertraline made a big difference. No other SE. Wonders about history of diarrhea, not debilitating. Predates sertraline  06/02/21 appt noted: Doing fine except recently a little more anxiety and depression and anxiety dreams. Stopped sertraline and Rexulti for the last week.  Worried she might be drinking too much  and that it was interacting with school.  Trying to cut back on drinking.  Drinking problem for some years.   Stopped sertraline 150 mg and Rexulti 2 abruptly but denies withdrawal.   Feeling more depressed and anxious since October. Teacher and it's difficult. Seasonal depression too. Anxiety is worse in AM regardless of meds or alcohol.  Feels uneasy and not wanting to get day started. Plan: Restart Rexulti 2 mg daily Clonidine 0.1 mg 1/2 tablet at night for 2 nights then 1 at night for 7 nights then 1/2 tablet in AM and 1 tablet at night DC sertraline because of lack of sufficient benefit as well as disturbing dreams and suppressed tearfulness  07/19/2021 appointment with the following noted: Forgets am clonidine often 0.05 but remembers 0.1 mg HS with less am anxiety and dread and sleeping really well.  Dreams are better with clonidine.  Stopped drinking weeknights and sleep is better.  But was irritable in the am last week but it gets better. Mood overall  is OK.  School is huge stressor but handling it well.   No SE that are a problem. Less anxious.  Depression is not high.  Better able to cry off the sertraline and did cry more over th holidays right after stopping the sertraline. HA manageable and less in AM since stopped alcohol. No concerns with Rexulti. Plan: Continue Rexulti 2 mg daily Better with Clonidine 0.1 mg  1/2 tablet in AM and 1 tablet at night  03/29/2022 appointment noted: Still pleased  with meds.  Could tell if misses Rexulti a couple of days. No major mood swings. Anxiety is manageable. 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 Sleep is good.  10 hours. No panic.  Anxiety is pretty good. Doing things she couldn't do before DT anxiety. Able to enjoy things.  Socializes.   Plan no med changes  09/28/22 appt noted: Brief period off DT coupon code but back on it. Re: Rexulti Continues clonidine 0.1 BID. No othert meds. Been doing ok but more dep concerns.  Some situational.  But weekends doesn't want to do antything except sleep excessively.  Seems like hitting a wall and unable to get up.  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  Reduced interest in things.   Some worry over poss car crash.   Alcohol not worse.  Avg 5-6/d.  Not affecting her the next day.   Plan: Continue Rexulti 2 mg daily Better with Clonidine 0.1 mg  1/2 tablet in AM and 1 tablet at night.  Helped anxiety For depression Wellbutrin but has to be IR bc phobia and needs to crush pills so100 mg 1/2 TID for a week then 1 TID  03/13/23 appt noted: I feel great.  Best in a long time.  Wellbutrin helped a lot. Still crushing pills.  Hard to swallow pills at age 63 .   Anxiety way lower than usual.  Dep resolved. Easier to keep herself going.  Better energy after work.  Mind clearer.   Emotions not suppressed. Last week started going to the gym again.  Waking up easier.  Less excessive sleep when off work. No SE. Sleep is pretty good.  Sometimes anxiety dreams.  Without reason usually.  But sometimes processing emotional things.   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 Alcohol lessened a little.  Mostly just white claws down from 8 to 4.  Some nights only 2. Doesn't drive after drinking.   Plan: Continue Rexulti 2 mg daily Better with Clonidine 0.1 mg  1/2 tablet in AM and 1 tablet at night.  Helped anxiety Much better with depression Wellbutrin 100 mg  1 TID  09/17/23 appt  noted:  Med: Rexulti 2 mg daily, bupropion 100 mg 3 times daily, clonidine 0.1 mg tablets one every morning  No SE No NM.  Sleep is good.  Sleeping less in a healthy way.  Better balance on WE. Overall really well.   Wellbutrin done wonders.  beTTER FUNction and others notice. Back into therapy with old therapist.  Going really well.   Overall really please.   Work function is fine.   No major mood swings.  Patient reports stable mood and denies depressed or irritable moods.  Patient denies any recent difficulty with anxiety.  Patient denies difficulty with sleep initiation or maintenance. Denies appetite disturbance.  Patient reports that energy and motivation have been good.  Patient denies any difficulty with concentration.  Patient denies any suicidal ideation. No health  px.   Past Psychiatric Medication Trials: Fluoxetine 40 with Abilify 5 mg akathisia,  Lexapro increased anxiety for 3 weeks. Zoloft 150 with Rexulti 2 mg good; sertraline couldn't cry & other SE Last significant manic episode mid 2018.  History of a lot of mixed symptoms.  Review of Systems:  Review of Systems  Constitutional:  Negative for fatigue.  Gastrointestinal:  Negative for diarrhea.  Musculoskeletal:  Negative for myalgias.  Neurological:  Negative for dizziness, tremors, weakness and headaches.  Psychiatric/Behavioral:  Negative for agitation, behavioral problems, confusion, decreased concentration, dysphoric mood, hallucinations, self-injury, sleep disturbance and suicidal ideas. The patient is not nervous/anxious and is not hyperactive.     Medications: I have reviewed the patient's current medications.  Current Outpatient Medications  Medication Sig Dispense Refill   drospirenone-ethinyl estradiol (YAZ) 3-0.02 MG tablet Take 1 tablet by mouth daily.     ibuprofen (ADVIL) 200 MG tablet Take 200 mg by mouth every 6 (six) hours as needed.     Multiple Vitamin (MULTIVITAMIN) tablet Take 1 tablet by mouth  daily.     brexpiprazole (REXULTI) 2 MG TABS tablet Take 1 tablet (2 mg total) by mouth daily. 90 tablet 2   buPROPion (WELLBUTRIN) 100 MG tablet TAKE 1 TABLET BY MOUTH THREE TIMES DAILY 270 tablet 2   cloNIDine (CATAPRES) 0.1 MG tablet Take 1 tablet (0.1 mg total) by mouth 2 (two) times daily. 180 tablet 1   No current facility-administered medications for this visit.    Medication Side Effects: Other: no restlessness like abilify.  Allergies: No Known Allergies  Past Medical History:  Diagnosis Date   Chicken pox    Headache(784.0)     Family History  Problem Relation Age of Onset   Hypertension Father    Hyperlipidemia Father    Diabetes Maternal Grandmother        type ll   Diabetes Maternal Grandfather        type ll   Stroke Paternal Grandmother    Hypertension Paternal Grandmother    Breast cancer Paternal Grandmother        postmenopausal   Cancer Paternal Grandfather        prostate   Hyperlipidemia Paternal Grandfather     Social History   Socioeconomic History   Marital status: Single    Spouse name: Not on file   Number of children: Not on file   Years of education: Not on file   Highest education level: Not on file  Occupational History   Not on file  Tobacco Use   Smoking status: Never   Smokeless tobacco: Never  Vaping Use   Vaping status: Never Used  Substance and Sexual Activity   Alcohol use: Yes    Alcohol/week: 10.0 - 11.0 standard drinks of alcohol    Types: 6 - 7 Cans of beer, 4 Glasses of wine per week    Comment: Trying to cut back   Drug use: Yes    Types: Marijuana   Sexual activity: Not on file  Other Topics Concern   Not on file  Social History Narrative   Not on file   Social Drivers of Health   Financial Resource Strain: Not on file  Food Insecurity: Not on file  Transportation Needs: Not on file  Physical Activity: Not on file  Stress: Not on file  Social Connections: Not on file  Intimate Partner Violence: Not on  file    Past Medical History, Surgical history, Social history, and Family history  were reviewed and updated as appropriate.   Please see review of systems for further details on the patient's review from today.   Objective:   Physical Exam:  There were no vitals taken for this visit.  Physical Exam Neurological:     Mental Status: She is alert and oriented to person, place, and time.     Cranial Nerves: No dysarthria.  Psychiatric:        Attention and Perception: Attention normal.        Mood and Affect: Mood is not anxious or depressed. Affect is not tearful.        Speech: Speech normal.        Behavior: Behavior is cooperative.        Thought Content: Thought content normal. Thought content is not paranoid or delusional. Thought content does not include homicidal or suicidal ideation. Thought content does not include suicidal plan.        Cognition and Memory: Cognition and memory normal.        Judgment: Judgment normal.     Comments: insight good. Dep resolved. No AIM      Lab Review:     Component Value Date/Time   NA 136 07/05/2021 0950   K 4.6 07/05/2021 0950   CL 101 07/05/2021 0950   CO2 28 07/05/2021 0950   GLUCOSE 104 (H) 07/05/2021 0950   BUN 9 07/05/2021 0950   CREATININE 0.73 07/05/2021 0950   CALCIUM 9.3 07/05/2021 0950   PROT 7.8 07/05/2021 0950   ALBUMIN 4.4 07/05/2021 0950   AST 23 07/05/2021 0950   ALT 28 07/05/2021 0950   ALKPHOS 55 07/05/2021 0950   BILITOT 0.3 07/05/2021 0950       Component Value Date/Time   WBC 8.0 07/05/2021 0950   RBC 4.64 07/05/2021 0950   HGB 12.9 07/05/2021 0950   HCT 40.2 07/05/2021 0950   PLT 353.0 07/05/2021 0950   MCV 86.6 07/05/2021 0950   MCHC 32.2 07/05/2021 0950   RDW 16.8 (H) 07/05/2021 0950   LYMPHSABS 1.3 07/05/2021 0950   MONOABS 0.5 07/05/2021 0950   EOSABS 0.3 07/05/2021 0950   BASOSABS 0.0 07/05/2021 0950    No results found for: "POCLITH", "LITHIUM"   No results found for: "PHENYTOIN",  "PHENOBARB", "VALPROATE", "CBMZ"   .res Assessment: Plan:    Bipolar 1 disorder, mixed, moderate (HCC) - Plan: brexpiprazole (REXULTI) 2 MG TABS tablet, buPROPion (WELLBUTRIN) 100 MG tablet  Generalized anxiety disorder - Plan: cloNIDine (CATAPRES) 0.1 MG tablet  Panic disorder with agoraphobia - Plan: cloNIDine (CATAPRES) 0.1 MG tablet  Alcohol dependence with uncomplicated intoxication (HCC)   Pill swallowing phobia  We discussed Recently diagnosed bipolar disorder mixed.   No mixed sx controlled but is depressed.  She also has acknowledged problem with alcohol.  We discussed treatment options.  She is not currently missing work.  Reduced alcohol by 50%.     No impairment.  Overall much improved mood stability and anxiety and sleep with return to Rexulti 2 mg daily and the addition of clonidine 0.1 mg nightly and, when she remembers, 0.05 mg every morning.  She is struggling to become more consistent with the morning dose.  However already she has had improvement in sleep and wakes up with less anxiety and dread as a result of the clonidine at night.  She is tolerating the meds well .   Discussed potential metabolic side effects associated with atypical antipsychotics, as well as potential risk for movement side effects.  Advised pt to contact office if movement side effects occur.   On normal dosage of Rexulti 2 mg.  This is being used off label as a mood stabilizer.  So far it is been effective.  She understands it is off label.  Still going well.  Call if there is any development of manic symptoms or worsening mood swings.  Continue Rexulti 2 mg daily Better with Clonidine 0.1 mg  1/2 tablet in AM and 1 tablet at night.  Helped anxiety Much better with depression Wellbutrin 100 mg  1 TID  Follow-up 6 mos  Meredith Staggers, MD, DFAPA  Please see After Visit Summary for patient specific instructions.  No future appointments.    No orders of the defined types were placed in this  encounter.      -------------------------------

## 2024-06-17 ENCOUNTER — Ambulatory Visit (INDEPENDENT_AMBULATORY_CARE_PROVIDER_SITE_OTHER): Admitting: Psychiatry

## 2024-06-30 ENCOUNTER — Other Ambulatory Visit: Payer: Self-pay | Admitting: Psychiatry

## 2024-06-30 DIAGNOSIS — F3162 Bipolar disorder, current episode mixed, moderate: Secondary | ICD-10-CM

## 2024-07-21 ENCOUNTER — Encounter: Payer: Self-pay | Admitting: Psychiatry

## 2024-07-21 ENCOUNTER — Telehealth: Admitting: Psychiatry

## 2024-07-21 DIAGNOSIS — F4001 Agoraphobia with panic disorder: Secondary | ICD-10-CM

## 2024-07-21 DIAGNOSIS — F10220 Alcohol dependence with intoxication, uncomplicated: Secondary | ICD-10-CM

## 2024-07-21 DIAGNOSIS — F3162 Bipolar disorder, current episode mixed, moderate: Secondary | ICD-10-CM

## 2024-07-21 DIAGNOSIS — F411 Generalized anxiety disorder: Secondary | ICD-10-CM

## 2024-07-21 MED ORDER — BREXPIPRAZOLE 2 MG PO TABS: 2.0000 mg | ORAL_TABLET | Freq: Every day | ORAL | 1 refills | Status: AC

## 2024-07-21 MED ORDER — BUPROPION HCL 100 MG PO TABS: ORAL_TABLET | ORAL | 1 refills | Status: AC

## 2024-07-21 MED ORDER — CLONIDINE HCL 0.1 MG PO TABS: 0.1000 mg | ORAL_TABLET | Freq: Two times a day (BID) | ORAL | 1 refills | Status: AC

## 2025-01-20 ENCOUNTER — Ambulatory Visit: Admitting: Psychiatry
